# Patient Record
Sex: Female | Born: 1980 | Hispanic: Yes | Marital: Married | Smoking: Never smoker
Health system: Southern US, Community
[De-identification: ages and names within clinical notes are randomized; demographics above are authoritative.]

## PROBLEM LIST (undated history)

## (undated) ENCOUNTER — Inpatient Hospital Stay (HOSPITAL_COMMUNITY): Payer: Self-pay

## (undated) DIAGNOSIS — Z789 Other specified health status: Secondary | ICD-10-CM

## (undated) DIAGNOSIS — O139 Gestational [pregnancy-induced] hypertension without significant proteinuria, unspecified trimester: Secondary | ICD-10-CM

## (undated) DIAGNOSIS — E039 Hypothyroidism, unspecified: Secondary | ICD-10-CM

## (undated) HISTORY — PX: NO PAST SURGERIES: SHX2092

## (undated) HISTORY — DX: Gestational (pregnancy-induced) hypertension without significant proteinuria, unspecified trimester: O13.9

---

## 2017-04-03 LAB — OB RESULTS CONSOLE ANTIBODY SCREEN: Antibody Screen: NEGATIVE

## 2017-04-03 LAB — OB RESULTS CONSOLE HIV ANTIBODY (ROUTINE TESTING): HIV: NONREACTIVE

## 2017-04-03 LAB — OB RESULTS CONSOLE HEPATITIS B SURFACE ANTIGEN: HEP B S AG: NEGATIVE

## 2017-04-03 LAB — OB RESULTS CONSOLE RPR: RPR: NONREACTIVE

## 2017-04-03 LAB — OB RESULTS CONSOLE ABO/RH: RH TYPE: POSITIVE

## 2017-04-03 LAB — OB RESULTS CONSOLE RUBELLA ANTIBODY, IGM: Rubella: IMMUNE

## 2017-04-03 LAB — OB RESULTS CONSOLE GC/CHLAMYDIA
Chlamydia: NEGATIVE
Gonorrhea: NEGATIVE

## 2017-04-04 ENCOUNTER — Encounter (HOSPITAL_COMMUNITY): Payer: Self-pay

## 2017-04-04 ENCOUNTER — Inpatient Hospital Stay (HOSPITAL_COMMUNITY)
Admission: AD | Admit: 2017-04-04 | Discharge: 2017-04-04 | Disposition: A | Discharge status: Home / Self Care | Payer: Managed Care, Other (non HMO) | Source: Ambulatory Visit | Attending: Obstetrics and Gynecology | Admitting: Obstetrics and Gynecology

## 2017-04-04 DIAGNOSIS — E860 Dehydration: Secondary | ICD-10-CM

## 2017-04-04 DIAGNOSIS — Z3A10 10 weeks gestation of pregnancy: Secondary | ICD-10-CM | POA: Insufficient documentation

## 2017-04-04 DIAGNOSIS — O99281 Endocrine, nutritional and metabolic diseases complicating pregnancy, first trimester: Secondary | ICD-10-CM | POA: Insufficient documentation

## 2017-04-04 DIAGNOSIS — O219 Vomiting of pregnancy, unspecified: Secondary | ICD-10-CM | POA: Insufficient documentation

## 2017-04-04 HISTORY — DX: Other specified health status: Z78.9

## 2017-04-04 LAB — URINALYSIS, ROUTINE W REFLEX MICROSCOPIC
BILIRUBIN URINE: NEGATIVE
Bacteria, UA: NONE SEEN
GLUCOSE, UA: 50 mg/dL — AB
Hgb urine dipstick: NEGATIVE
Ketones, ur: 80 mg/dL — AB
NITRITE: NEGATIVE
PH: 5 (ref 5.0–8.0)
Protein, ur: 30 mg/dL — AB
Specific Gravity, Urine: 1.033 — ABNORMAL HIGH (ref 1.005–1.030)

## 2017-04-04 LAB — POCT PREGNANCY, URINE: Preg Test, Ur: POSITIVE — AB

## 2017-04-04 MED ORDER — DEXTROSE IN LACTATED RINGERS 5 % IV SOLN
Freq: Once | INTRAVENOUS | Status: AC
  Administered 2017-04-04: 23:00:00 via INTRAVENOUS
  Filled 2017-04-04: qty 10

## 2017-04-04 MED ORDER — SODIUM CHLORIDE 0.9 % IV SOLN
25.0000 mg | Freq: Once | INTRAVENOUS | Status: AC
  Administered 2017-04-04: 25 mg via INTRAVENOUS
  Filled 2017-04-04: qty 1

## 2017-04-04 NOTE — MAU Provider Note (Signed)
Chief Complaint: Nausea and Emesis   First Provider Initiated Contact with Patient 04/04/17 2054        SUBJECTIVE HPI: Stacey Parks is a 36 y.o. G1P0 at [redacted]w[redacted]d by LMP who presents to maternity admissions reporting nausea and vomiting. Has been on [place] but has not taken it since Tuesday "because it stopped working".  Has been drinking coconut water but vomited that today.. She denies vaginal bleeding, vaginal itching/burning, urinary symptoms, h/a, dizziness, or fever/chills.    Emesis   This is a recurrent problem. The current episode started 1 to 4 weeks ago. The problem occurs 2 to 4 times per day. The problem has been unchanged. There has been no fever. Pertinent negatives include no abdominal pain, chills, diarrhea, fever, headaches or myalgias. Treatments tried: [place], but stopped taking it.    RN Note: Pt c/o nausea and vomiting for 3 weeks. States she is [redacted] weeks pregnant. States EDD is 10/29/2017. States she is unable to keep anything down. States she has a lot of burning in epigastric area. States she has a prescription for nausea medication-does not help. States she last took the medicine on Tuesday. Pt denies vaginal bleeding.   No past medical history on file. No past surgical history on file. Social History   Social History  . Marital status: Married    Spouse name: N/A  . Number of children: N/A  . Years of education: N/A   Occupational History  . Not on file.   Social History Main Topics  . Smoking status: Not on file  . Smokeless tobacco: Not on file  . Alcohol use Not on file  . Drug use: Unknown  . Sexual activity: Not on file   Other Topics Concern  . Not on file   Social History Narrative  . No narrative on file   No current facility-administered medications on file prior to encounter.    No current outpatient prescriptions on file prior to encounter.   No Known Allergies  I have reviewed patient's Past Medical Hx, Surgical Hx,  Family Hx, Social Hx, medications and allergies.   ROS:  Review of Systems  Constitutional: Negative for chills and fever.  Gastrointestinal: Positive for vomiting. Negative for abdominal pain and diarrhea.  Musculoskeletal: Negative for myalgias.  Neurological: Negative for headaches.   Review of Systems  Other systems negative   Physical Exam  Physical Exam Patient Vitals for the past 24 hrs:  BP Temp Temp src Pulse Resp SpO2 Height Weight  04/04/17 2006 124/68 98.1 F (36.7 C) Oral 83 16 100 %  (1.626 m) 151 lb (68.5 kg)   Constitutional: Well-developed, well-nourished female in no acute distress.  Cardiovascular: normal rate Respiratory: normal effort GI: Abd soft, non-tender. Pos BS x 4 MS: Extremities nontender, no edema, normal ROM Neurologic: Alert and oriented x 4.  GU: Neg CVAT.  PELVIC EXAM: Deferred  FHT 155 by bedside ultrasound  LAB RESULTS Results for orders placed or performed during the hospital encounter of 04/04/17 (from the past 24 hour(s))  Urinalysis, Routine w reflex microscopic     Status: Abnormal   Collection Time: 04/04/17  8:10 PM  Result Value Ref Range   Color, Urine AMBER (A) YELLOW   APPearance HAZY (A) CLEAR   Specific Gravity, Urine 1.033 (H) 1.005 - 1.030   pH 5.0 5.0 - 8.0   Glucose, UA 50 (A) NEGATIVE mg/dL   Hgb urine dipstick NEGATIVE NEGATIVE   Bilirubin Urine NEGATIVE NEGATIVE  Ketones, ur 80 (A) NEGATIVE mg/dL   Protein, ur 30 (A) NEGATIVE mg/dL   Nitrite NEGATIVE NEGATIVE   Leukocytes, UA TRACE (A) NEGATIVE   RBC / HPF 0-5 0 - 5 RBC/hpf   WBC, UA 0-5 0 - 5 WBC/hpf   Bacteria, UA NONE SEEN NONE SEEN   Squamous Epithelial / LPF 0-5 (A) NONE SEEN   Mucus PRESENT   Pregnancy, urine POC     Status: Abnormal   Collection Time: 04/04/17  8:25 PM  Result Value Ref Range   Preg Test, Ur POSITIVE (A) NEGATIVE       IMAGING No results found.  MAU Management/MDM: Ordered IV fluids with Phenergan and with  Multivitamin.  Felt better after fluids Discussed restarting her [place] tonight Also discussed advancing as tolerated, discussed eating a few bites every 2 hours Also discussed that we can use other meds, so notify MD if not feeling better   ASSESSMENT Single IUP at [redacted]w[redacted]d Nausea and vomiting of pregnancy Dehydration  PLAN Discharge home Has Bonjesta at home Advance diet as tolerated Follow up in office  Pt stable at time of discharge. Encouraged to return here or to other Urgent Care/ED if she develops worsening of symptoms, increase in pain, fever, or other concerning symptoms.    Wynelle Bourgeois CNM, MSN Certified Nurse-Midwife 04/04/2017  9:02 PM

## 2017-04-04 NOTE — Discharge Instructions (Signed)
Desidratao, adulto (Dehydration, Adult) A desidratao indica que seu corpo no tem lquidos ou gua suficientes de que ele precisa. Ela acontece quando voc ingere menos lquidos do que perde. Seus rins, crebro e corao no funcionam de maneira apropriada sem a quantidade correta de lquidos.  A desidratao pode variar de leve a intensa. Ela deve ser tratada imediatamente para impedir que se torne intensa. TRATAMENTO DOMICILIAR  Beba lquidos em quantidade suficiente para manter seu xixi (urina) claro ou na cor amarela plida.  Tome gua ou outro lquido lentamente com goles pequenos. Voc pode tambm tentar chupar cubos de gelo.  Ingira alimentos ou bebidas que contenham eletrlitos. Exemplos incluem bananas e bebidas esportivas.  Tome medicamentos vendidos com ou sem prescrio somente conforme indicado pelo seu mdico.  Prepare a soluo de reidratao oral (oral rehydration solution, ORS) de acordo com as instrues que a acompanham. Tome goles da ORS a cada 5 minutos at [employer] retorne ao normal.  Caso vomite (vmito) ou suas fezes estejam lquidas (diarreia), continue tentando beber gua, ORS ou ambas.  Caso sua fezes estejam lquidas, evite: ? Bebidas com cafena. ? Suco de frutas. ? Leite. ? Bebidas carbonatadas.  No tome comprimidos de sal. Eles podem levar a uma concentrao excessiva de sdio no organismo (hipernatremia). OBTENHA AJUDA SE:  No conseguir comer ou beber sem vomitar.  Tiver diarreia leve por mais de 24 horas.  Tiver febre. OBTENHA AJUDA IMEDIATAMENTE SE:   Sentir sede muito forte.  Tiver diarreia muito intensa.  No urinar por 6-8 horas ou urinar somente uma pequena quantidade de urina muito escura.  Sua pele ficar enrugada.  Sentir tontura, confuso ou ambas as coisas. Estas informaes no se destinam a substituir as recomendaes de seu mdico. No deixe de discutir quaisquer dvidas com seu mdico. Document Released: 01/15/2011  Document Revised: 03/23/2015 Elsevier Interactive Patient Education  2017 Elsevier Inc.  Hipermese gravdica (Hyperemesis Gravidarum) A hipermese gravdica  um quadro de nuseas e vmitos intensos que acontece durante a Environmental education officer. A hipermese  pior que as nuseas matutinas. Ela pode fazer com que voc tenha nuseas ou vomite o dia inteiro por muitos dias. Ela pode impedir que voc coma e beba em quantidade suficiente. A hipermese geralmente ocorre durante o primeiro semestre (as primeiras 20 semanas) de gestao. Ela muitas vezes desaparece quando a Consulting civil engineer chega  [employer]. Entretanto, a hipermese s vezes continua durante toda a [place]. CAUSAS A causa deste quadro clnico no  completamente conhecida, mas considera-se que ela esteja relacionada a alteraes dos hormnios durante a [place]. Ela pode ser decorrente do nvel elevado do hormnio da [place] ou de uma elevao do nvel de estrognio no organismo. SINAIS E SINTOMAS  Nuseas e vmitos intensos.  Nuseas que no passam.  Vmitos que no permitem que voc retenha nenhum alimento.  Perda de peso e fluido corporal (desidratao).  Ausncia de vontade de comer ou incapacidade de apreciar [employer] quais voc gostava anteriormente.  DIAGNSTICO Seu mdico far um exame fsico e perguntar sobre seus sintomas. Ele poder tambm solicitar exames de sangue e de urina para se certificar de que nenhuma outra coisa esteja causando o problema. TRATAMENTO Talvez voc s precise de medicamentos para controlar o problema. Caso medicamentos no controlem as nuseas e os vmitos, voc ser tratada [employer] hospital para prevenir desidratao, aumento da acidez do sangue (acidose), perda de peso e [employer] eletrlitos no organismo, que podem prejudicar o beb ainda no nascido (feto). Voc poder precisar de [employer]  de fluidos por via IV. INSTRUES PARA TRATAMENTO DOMICILIAR  Somente tome medicamentos de  venda livre ou vendidos com prescrio mdica conforme orientado por seu mdico.  Tente comer alguns biscoitos de gua e sal ou torradas pela manh antes de se levantar.  Evite alimentos e cheiros que irritem seu estmago.  Evite alimentos gordurosos e temperados.  Faa 5 a 6 pequenas refeies por dia.  No beba lquidos durante as refeies. Beba lquidos entre as [employer].  Na hora do lanche, coma alimentos ricos em protenas, como queijo.  Coma ou chupe alimentos que contm gengibre. O gengibre ajuda a diminuir as nuseas.  Evite cozinhar. O cheiro da comida pode Water quality scientist.  Evite plulas de ferro e multivitamnicos com ferro at 3 a 4 meses de [place]. Mas consulte seu mdico antes de parar de tomar qualquer plula de ferro prescrita.  PROCURE UM MDICO SE:  A dor abdominal aumentar.  Tiver dor de cabea intensa.  Apresentar problemas de viso.  Perder peso.  PROCURE UM MDICO IMEDIATAMENTE SE:  No conseguir reter lquidos.  Vomitar sangue.  Tiver nuseas ou vmitos constantes.  Apresentar fraqueza extrema.  Tiver sede extrema.  Sentir tontura ou fraqueza.  Tiver febre ou sintomas persistentes por mais de 2 a 3 dias.  Tiver febre e seus sintomas piorarem repentinamente.  CERTIFIQUE-SE DE:  Compreender estas instrues.  Observar seu estado de sade.  Procurar um mdico imediatamente se no se sentir bem ou piorar.  Estas informaes no se destinam a substituir as recomendaes de seu mdico. No deixe de discutir quaisquer dvidas com seu mdico. Document Released: 07/02/2005 Document Revised: 04/22/2013 Document Reviewed: 02/11/2013 Elsevier Interactive Patient Education  2017 [employer].

## 2017-04-04 NOTE — MAU Note (Signed)
Pt c/o nausea and vomiting for 3 weeks. States she is [redacted] weeks pregnant. States EDD is 10/29/2017. States she is unable to keep anything down. States she has a lot of burning in epigastric area. States she has a prescription for nausea medication-does not help. States she last took the medicine on Tuesday. Pt denies vaginal bleeding.

## 2017-04-15 ENCOUNTER — Inpatient Hospital Stay (HOSPITAL_COMMUNITY)
Admission: AD | Admit: 2017-04-15 | Discharge: 2017-04-15 | Disposition: A | Discharge status: Home / Self Care | Payer: Managed Care, Other (non HMO) | Source: Ambulatory Visit | Attending: Obstetrics and Gynecology | Admitting: Obstetrics and Gynecology

## 2017-04-15 ENCOUNTER — Encounter (HOSPITAL_COMMUNITY): Payer: Self-pay | Admitting: *Deleted

## 2017-04-15 DIAGNOSIS — O99281 Endocrine, nutritional and metabolic diseases complicating pregnancy, first trimester: Secondary | ICD-10-CM | POA: Insufficient documentation

## 2017-04-15 DIAGNOSIS — Z3A11 11 weeks gestation of pregnancy: Secondary | ICD-10-CM | POA: Diagnosis not present

## 2017-04-15 DIAGNOSIS — O210 Mild hyperemesis gravidarum: Secondary | ICD-10-CM | POA: Insufficient documentation

## 2017-04-15 DIAGNOSIS — R1110 Vomiting, unspecified: Secondary | ICD-10-CM | POA: Diagnosis present

## 2017-04-15 DIAGNOSIS — E860 Dehydration: Secondary | ICD-10-CM | POA: Insufficient documentation

## 2017-04-15 LAB — URINALYSIS, ROUTINE W REFLEX MICROSCOPIC
BILIRUBIN URINE: NEGATIVE
Cellular Cast, UA: 4
GLUCOSE, UA: 50 mg/dL — AB
HGB URINE DIPSTICK: NEGATIVE
KETONES UR: 80 mg/dL — AB
LEUKOCYTES UA: NEGATIVE
Nitrite: NEGATIVE
PROTEIN: 30 mg/dL — AB
Specific Gravity, Urine: 1.026 (ref 1.005–1.030)
pH: 5 (ref 5.0–8.0)

## 2017-04-15 LAB — BASIC METABOLIC PANEL
Anion gap: 11 (ref 5–15)
BUN: 6 mg/dL (ref 6–20)
CALCIUM: 10.2 mg/dL (ref 8.9–10.3)
CO2: 18 mmol/L — AB (ref 22–32)
CREATININE: 0.56 mg/dL (ref 0.44–1.00)
Chloride: 103 mmol/L (ref 101–111)
GFR calc non Af Amer: >60 mL/min (ref >60)
Glucose, Bld: 92 mg/dL (ref 65–99)
Potassium: 3.5 mmol/L (ref 3.5–5.1)
Sodium: 132 mmol/L — ABNORMAL LOW (ref 135–145)

## 2017-04-15 LAB — CBC
HEMATOCRIT: 37.2 % (ref 36.0–46.0)
Hemoglobin: 13.5 g/dL (ref 12.0–15.0)
MCH: 30.3 pg (ref 26.0–34.0)
MCHC: 36.3 g/dL — AB (ref 30.0–36.0)
MCV: 83.6 fL (ref 78.0–100.0)
Platelets: 269 10*3/uL (ref 150–400)
RBC: 4.45 MIL/uL (ref 3.87–5.11)
RDW: 12.7 % (ref 11.5–15.5)
WBC: 8 10*3/uL (ref 4.0–10.5)

## 2017-04-15 MED ORDER — PROMETHAZINE HCL 25 MG/ML IJ SOLN
25.0000 mg | Freq: Once | INTRAMUSCULAR | Status: AC
  Administered 2017-04-15: 25 mg via INTRAVENOUS
  Filled 2017-04-15: qty 1

## 2017-04-15 MED ORDER — M.V.I. ADULT IV INJ
Freq: Once | INTRAVENOUS | Status: AC
  Administered 2017-04-15: 21:00:00 via INTRAVENOUS
  Filled 2017-04-15: qty 10

## 2017-04-15 MED ORDER — LACTATED RINGERS IV BOLUS (SEPSIS)
1000.0000 mL | Freq: Once | INTRAVENOUS | Status: AC
  Administered 2017-04-15: 1000 mL via INTRAVENOUS

## 2017-04-15 MED ORDER — FAMOTIDINE IN NACL 20-0.9 MG/50ML-% IV SOLN
20.0000 mg | Freq: Once | INTRAVENOUS | Status: AC
  Administered 2017-04-15: 20 mg via INTRAVENOUS
  Filled 2017-04-15: qty 50

## 2017-04-15 MED ORDER — PROMETHAZINE HCL 25 MG RE SUPP: 25.0000 mg | Freq: Four times a day (QID) | RECTAL | 2 refills | Status: DC | PRN

## 2017-04-15 NOTE — MAU Provider Note (Signed)
History     CSN: [id number]  Arrival date and time: 04/15/17 1658  First Provider Initiated Contact with Patient 04/15/17 1845      Chief Complaint  Patient presents with  . Emesis   HPI Stacey Parks is a 36 y.o. G1P0 at [redacted]w[redacted]d who presents with n/v. Had previously taken bonjesta, but discontinued b/c it wasn't working. Now taking promethazine but hasn't had any today b/c she keeps vomiting after taking it. Reports vomiting 5 times today. States she hasn't been able to eat since last week & vomits after drinking water & juice. Denies fever/chills, abdominal pain, vaginal bleeding, or diarrhea. States her last BM was 6 weeks ago. Endorses occasional heartburn.   OB History    Gravida Para Term Preterm AB Living   1         0   SAB TAB Ectopic Multiple Live Births                  Past Medical History:  Diagnosis Date  . Medical history non-contributory     Past Surgical History:  Procedure Laterality Date  . NO PAST SURGERIES      Family History  Problem Relation Age of Onset  . Heart disease Father   . Cancer Paternal Aunt   . Cancer Maternal Grandmother     Social History  Substance Use Topics  . Smoking status: Never Smoker  . Smokeless tobacco: Never Used  . Alcohol use No    Allergies: No Known Allergies  No prescriptions prior to admission.    Review of Systems  Constitutional: Negative.   Gastrointestinal: Positive for constipation, nausea and vomiting. Negative for abdominal pain and diarrhea.  Genitourinary: Negative.    Physical Exam   Blood pressure 113/80, pulse 95, temperature 98.4 F (36.9 C), temperature source Oral, resp. rate 16, weight 143 lb (64.9 kg), last menstrual period 01/23/2017, SpO2 100 %.  Physical Exam  Nursing note and vitals reviewed. Constitutional: She is oriented to person, place, and time. She appears well-developed and well-nourished. No distress.  HENT:  Head: Normocephalic and atraumatic.  Eyes:  Conjunctivae are normal. Right eye exhibits no discharge. Left eye exhibits no discharge. No scleral icterus.  Neck: Normal range of motion.  Cardiovascular: Normal rate, regular rhythm and normal heart sounds.   No murmur heard. Respiratory: Effort normal and breath sounds normal. No respiratory distress. She has no wheezes.  GI: Soft. Bowel sounds are normal. She exhibits no distension. There is no tenderness. There is no rebound and no guarding.  Neurological: She is alert and oriented to person, place, and time.  Skin: Skin is warm and dry. She is not diaphoretic.  Psychiatric: She has a normal mood and affect. Her behavior is normal. Judgment and thought content normal.    MAU Course  Procedures Results for orders placed or performed during the hospital encounter of 04/15/17 (from the past 24 hour(s))  Urinalysis, Routine w reflex microscopic     Status: Abnormal   Collection Time: 04/15/17  4:58 PM  Result Value Ref Range   Color, Urine AMBER (A) YELLOW   APPearance HAZY (A) CLEAR   Specific Gravity, Urine 1.026 1.005 - 1.030   pH 5.0 5.0 - 8.0   Glucose, UA 50 (A) NEGATIVE mg/dL   Hgb urine dipstick NEGATIVE NEGATIVE   Bilirubin Urine NEGATIVE NEGATIVE   Ketones, ur 80 (A) NEGATIVE mg/dL   Protein, ur 30 (A) NEGATIVE mg/dL   Nitrite NEGATIVE NEGATIVE  Leukocytes, UA NEGATIVE NEGATIVE   RBC / HPF 0-5 0 - 5 RBC/hpf   WBC, UA 0-5 0 - 5 WBC/hpf   Bacteria, UA RARE (A) NONE SEEN   Squamous Epithelial / LPF 0-5 (A) NONE SEEN   Mucus PRESENT    Hyaline Casts, UA PRESENT    Cellular Cast, UA 4   CBC     Status: Abnormal   Collection Time: 04/15/17  7:17 PM  Result Value Ref Range   WBC 8.0 4.0 - 10.5 K/uL   RBC 4.45 3.87 - 5.11 MIL/uL   Hemoglobin 13.5 12.0 - 15.0 g/dL   HCT 16.1 09.6 - 04.5 %   MCV 83.6 78.0 - 100.0 fL   MCH 30.3 26.0 - 34.0 pg   MCHC 36.3 (H) 30.0 - 36.0 g/dL   RDW 40.9 81.1 - 91.4 %   Platelets 269 150 - 400 K/uL  Basic metabolic panel     Status:  Abnormal   Collection Time: 04/15/17  7:17 PM  Result Value Ref Range   Sodium 132 (L) 135 - 145 mmol/L   Potassium 3.5 3.5 - 5.1 mmol/L   Chloride 103 101 - 111 mmol/L   CO2 18 (L) 22 - 32 mmol/L   Glucose, Bld 92 65 - 99 mg/dL   BUN 6 6 - 20 mg/dL   Creatinine, Ser 7.82 0.44 - 1.00 mg/dL   Calcium 95.6 8.9 - 21.3 mg/dL   GFR calc non Af Amer >60 >60 mL/min   GFR calc Af Amer >60 >60 mL/min   Anion gap 11 5 - 15    MDM FHT 160 by doppler U/a 80+ ketones. IV fluids x 2 bags (LR followed by MVI in D5LR). Phenergan 25 mg IV & pepcid 20 mg IV CBC, BMP  Care turned over to [facility] CNM  Judeth Horn, NP 04/15/2017 8:10 PM  Assessment and Plan   MDM Pt tolerated PO food and fluids after IV fluids/medications.  Consult Dr Senaida Ores with assessment and findings.  Offer Zofran to pt at [redacted]w[redacted]d, follow up this week, steroid taper at next visit in 1 week if needed.  Discussed with the patient the risk of Zofran use in the first trimester of pregnancy. Risks of use in the first trimester include birth defects in babies, specifically cleft lip/palate.  Pt declines using Zofran, although she is after risk of cleft lip/palate at [redacted]w[redacted]d. She prefers to discuss with Dr Mindi Slicker at her appointment on Wednesday. Pt has documented 8 lb weight loss in 1.5 weeks in MAU and per pt report has lost 27 lbs this pregnancy. Recommend pt try something different to get nausea under better control.  Discussed option of trying Phenergan suppositories when unable to keep down PO and adding Pepcid PO twice daily. Pt agrees that this may help her and she will consider other medications if symptoms persist.  Rx for Phenergan 25 mg suppositories Q 6 hours when unable to tolerate PO dose.  Pt will try Pepcid OTC.  She is stable at time of discharge.     A: 1. Hyperemesis of pregnancy   2. Mild dehydration     P: D/C home F/U with Dr Mindi Slicker 04/17/17 as scheduled  Sharen Counter, CNM 10:57 PM

## 2017-04-15 NOTE — MAU Note (Addendum)
Since Friday unable to keep water or juice down.  Prior to that had not been keeping food down.  (empty emesis bag in hand). Has had some abd pain, none now

## 2017-04-15 NOTE — MAU Note (Signed)
8lb documented loss here.

## 2017-04-15 NOTE — Discharge Instructions (Signed)
Try Pepcid 20 mg twice daily as needed along with your nausea medications. Take Phenergan 25 mg by mouth  OR use suppository but not both medications together.

## 2017-07-16 NOTE — L&D Delivery Note (Addendum)
Delivery Note Pt had another fetal deceleration into the 40s just as she was being prepared for pushing. Pitocin was stopped, position changes employed, scalp stim commenced and O2 via mask applied. FHR recovered with pushing and fetal descent was noted and appropriate. NICU team was notified and present. Pt pushed for 40 minutes and at 1:43 PM a viable female was delivered via Vaginal, Spontaneous (Presentation: OA;  ).  APGAR: 7, 9; weight pending .   Placenta status:mildly adherent, delivered intact with some manual application; duncan , .  Cord: 3vc with the following complications: none  Cord pH: n/a  Anesthesia:  Epidural Episiotomy: None Lacerations: 2nd degree Suture Repair: 2.0 3.0 vicryl Est. Blood Loss (mL): 650: boggy uterus despite pitocin per protocol, manual massage and confirmed adequate evacuation of uterus; no lacs noted on cervix or vagina. 800mcg cytotec placed rectally and bleeding improved; FF  Placenta to pathology  Mom to postpartum.  Baby to Couplet care / Skin to Skin.  [name] 11/01/2017, 2:32 PM

## 2017-10-01 LAB — OB RESULTS CONSOLE GBS: GBS: NEGATIVE

## 2017-10-28 ENCOUNTER — Telehealth (HOSPITAL_COMMUNITY): Payer: Self-pay | Admitting: *Deleted

## 2017-10-28 NOTE — Telephone Encounter (Signed)
Preadmission screen  

## 2017-10-29 ENCOUNTER — Inpatient Hospital Stay (HOSPITAL_COMMUNITY)
Admission: AD | Admit: 2017-10-29 | Payer: Managed Care, Other (non HMO) | Source: Ambulatory Visit | Admitting: Obstetrics and Gynecology

## 2017-11-01 ENCOUNTER — Encounter (HOSPITAL_COMMUNITY): Payer: Self-pay

## 2017-11-01 ENCOUNTER — Inpatient Hospital Stay (HOSPITAL_COMMUNITY): Payer: Managed Care, Other (non HMO) | Admitting: Anesthesiology

## 2017-11-01 ENCOUNTER — Inpatient Hospital Stay (HOSPITAL_COMMUNITY)
Admission: RE | Admit: 2017-11-01 | Discharge: 2017-11-03 | DRG: 806 | Disposition: A | Discharge status: Home / Self Care | Payer: Managed Care, Other (non HMO) | Source: Ambulatory Visit | Attending: Obstetrics and Gynecology | Admitting: Obstetrics and Gynecology

## 2017-11-01 DIAGNOSIS — D649 Anemia, unspecified: Secondary | ICD-10-CM | POA: Diagnosis present

## 2017-11-01 DIAGNOSIS — O480 Post-term pregnancy: Principal | ICD-10-CM | POA: Diagnosis present

## 2017-11-01 DIAGNOSIS — O9902 Anemia complicating childbirth: Secondary | ICD-10-CM | POA: Diagnosis present

## 2017-11-01 DIAGNOSIS — Z3A40 40 weeks gestation of pregnancy: Secondary | ICD-10-CM

## 2017-11-01 LAB — CBC
HCT: 37.5 % (ref 36.0–46.0)
Hemoglobin: 13.3 g/dL (ref 12.0–15.0)
MCH: 31.9 pg (ref 26.0–34.0)
MCHC: 35.5 g/dL (ref 30.0–36.0)
MCV: 89.9 fL (ref 78.0–100.0)
PLATELETS: 219 10*3/uL (ref 150–400)
RBC: 4.17 MIL/uL (ref 3.87–5.11)
RDW: 13.8 % (ref 11.5–15.5)
WBC: 8.5 10*3/uL (ref 4.0–10.5)

## 2017-11-01 LAB — TYPE AND SCREEN
ABO/RH(D): B POS
Antibody Screen: NEGATIVE

## 2017-11-01 LAB — ABO/RH: ABO/RH(D): B POS

## 2017-11-01 MED ORDER — LACTATED RINGERS IV SOLN: 500.0000 mL | Freq: Once | INTRAVENOUS | Status: DC

## 2017-11-01 MED ORDER — OXYTOCIN 40 UNITS IN LACTATED RINGERS INFUSION - SIMPLE MED: 2.5000 [IU]/h | INTRAVENOUS | Status: DC

## 2017-11-01 MED ORDER — OXYCODONE HCL 5 MG PO TABS: 10.0000 mg | ORAL_TABLET | ORAL | Status: DC | PRN

## 2017-11-01 MED ORDER — ACETAMINOPHEN 325 MG PO TABS
650.0000 mg | ORAL_TABLET | ORAL | Status: DC | PRN
  Administered 2017-11-02 (×2): 650 mg via ORAL
  Filled 2017-11-01 (×2): qty 2

## 2017-11-01 MED ORDER — SOD CITRATE-CITRIC ACID 500-334 MG/5ML PO SOLN: 30.0000 mL | ORAL | Status: DC | PRN

## 2017-11-01 MED ORDER — COCONUT OIL OIL: 1.0000 "application " | TOPICAL_OIL | Status: DC | PRN

## 2017-11-01 MED ORDER — SENNOSIDES-DOCUSATE SODIUM 8.6-50 MG PO TABS
2.0000 | ORAL_TABLET | ORAL | Status: DC
  Administered 2017-11-01 – 2017-11-02 (×2): 2 via ORAL
  Filled 2017-11-01 (×2): qty 2

## 2017-11-01 MED ORDER — OXYCODONE HCL 5 MG PO TABS
5.0000 mg | ORAL_TABLET | ORAL | Status: DC | PRN
  Administered 2017-11-02 – 2017-11-03 (×2): 5 mg via ORAL
  Filled 2017-11-01 (×2): qty 1

## 2017-11-01 MED ORDER — FENTANYL 2.5 MCG/ML BUPIVACAINE 1/10 % EPIDURAL INFUSION (WH - ANES)
14.0000 mL/h | INTRAMUSCULAR | Status: DC | PRN
  Administered 2017-11-01 (×2): 14 mL/h via EPIDURAL
  Filled 2017-11-01: qty 100

## 2017-11-01 MED ORDER — DIPHENHYDRAMINE HCL 50 MG/ML IJ SOLN: 12.5000 mg | INTRAMUSCULAR | Status: DC | PRN

## 2017-11-01 MED ORDER — FAMOTIDINE IN NACL 20-0.9 MG/50ML-% IV SOLN
INTRAVENOUS | Status: AC
  Filled 2017-11-01: qty 50

## 2017-11-01 MED ORDER — DIPHENHYDRAMINE HCL 25 MG PO CAPS: 25.0000 mg | ORAL_CAPSULE | Freq: Four times a day (QID) | ORAL | Status: DC | PRN

## 2017-11-01 MED ORDER — ZOLPIDEM TARTRATE 5 MG PO TABS: 5.0000 mg | ORAL_TABLET | Freq: Every evening | ORAL | Status: DC | PRN

## 2017-11-01 MED ORDER — LIDOCAINE HCL (PF) 1 % IJ SOLN
INTRAMUSCULAR | Status: DC | PRN
  Administered 2017-11-01: 8 mL

## 2017-11-01 MED ORDER — MISOPROSTOL 200 MCG PO TABS
800.0000 ug | ORAL_TABLET | Freq: Once | ORAL | Status: AC
  Administered 2017-11-01: 800 ug via RECTAL

## 2017-11-01 MED ORDER — ACETAMINOPHEN 325 MG PO TABS: 650.0000 mg | ORAL_TABLET | ORAL | Status: DC | PRN

## 2017-11-01 MED ORDER — WITCH HAZEL-GLYCERIN EX PADS: 1.0000 "application " | MEDICATED_PAD | CUTANEOUS | Status: DC | PRN

## 2017-11-01 MED ORDER — MISOPROSTOL 200 MCG PO TABS
ORAL_TABLET | ORAL | Status: AC
  Filled 2017-11-01: qty 4

## 2017-11-01 MED ORDER — TERBUTALINE SULFATE 1 MG/ML IJ SOLN
0.2500 mg | Freq: Once | INTRAMUSCULAR | Status: DC | PRN
Start: 2017-11-01 — End: 2017-11-01
  Filled 2017-11-01: qty 1

## 2017-11-01 MED ORDER — DIBUCAINE 1 % RE OINT: 1.0000 "application " | TOPICAL_OINTMENT | RECTAL | Status: DC | PRN

## 2017-11-01 MED ORDER — PRENATAL MULTIVITAMIN CH
1.0000 | ORAL_TABLET | Freq: Every day | ORAL | Status: DC
  Administered 2017-11-02 – 2017-11-03 (×2): 1 via ORAL
  Filled 2017-11-01 (×2): qty 1

## 2017-11-01 MED ORDER — IBUPROFEN 600 MG PO TABS
600.0000 mg | ORAL_TABLET | Freq: Four times a day (QID) | ORAL | Status: DC
  Administered 2017-11-01 – 2017-11-03 (×8): 600 mg via ORAL
  Filled 2017-11-01 (×8): qty 1

## 2017-11-01 MED ORDER — OXYCODONE-ACETAMINOPHEN 5-325 MG PO TABS: 2.0000 | ORAL_TABLET | ORAL | Status: DC | PRN

## 2017-11-01 MED ORDER — EPHEDRINE 5 MG/ML INJ
10.0000 mg | INTRAVENOUS | Status: DC | PRN
  Filled 2017-11-01: qty 2

## 2017-11-01 MED ORDER — LACTATED RINGERS IV SOLN
INTRAVENOUS | Status: DC
  Administered 2017-11-01: 1000 mL via INTRAVENOUS

## 2017-11-01 MED ORDER — ONDANSETRON HCL 4 MG/2ML IJ SOLN
4.0000 mg | Freq: Four times a day (QID) | INTRAMUSCULAR | Status: DC | PRN
  Administered 2017-11-01: 4 mg via INTRAVENOUS
  Filled 2017-11-01: qty 2

## 2017-11-01 MED ORDER — ONDANSETRON HCL 4 MG/2ML IJ SOLN: 4.0000 mg | INTRAMUSCULAR | Status: DC | PRN

## 2017-11-01 MED ORDER — BENZOCAINE-MENTHOL 20-0.5 % EX AERO
1.0000 "application " | INHALATION_SPRAY | CUTANEOUS | Status: DC | PRN
  Administered 2017-11-02: 1 via TOPICAL
  Filled 2017-11-01: qty 56

## 2017-11-01 MED ORDER — LACTATED RINGERS IV SOLN: 500.0000 mL | INTRAVENOUS | Status: DC | PRN

## 2017-11-01 MED ORDER — OXYCODONE-ACETAMINOPHEN 5-325 MG PO TABS: 1.0000 | ORAL_TABLET | ORAL | Status: DC | PRN

## 2017-11-01 MED ORDER — OXYTOCIN BOLUS FROM INFUSION: 500.0000 mL | Freq: Once | INTRAVENOUS | Status: DC

## 2017-11-01 MED ORDER — FENTANYL CITRATE (PF) 100 MCG/2ML IJ SOLN
50.0000 ug | INTRAMUSCULAR | Status: DC | PRN
  Administered 2017-11-01: 100 ug via INTRAVENOUS
  Filled 2017-11-01: qty 2

## 2017-11-01 MED ORDER — SIMETHICONE 80 MG PO CHEW: 80.0000 mg | CHEWABLE_TABLET | ORAL | Status: DC | PRN

## 2017-11-01 MED ORDER — FAMOTIDINE IN NACL 20-0.9 MG/50ML-% IV SOLN
20.0000 mg | Freq: Once | INTRAVENOUS | Status: AC
  Administered 2017-11-01: 20 mg via INTRAVENOUS

## 2017-11-01 MED ORDER — OXYTOCIN 40 UNITS IN LACTATED RINGERS INFUSION - SIMPLE MED
1.0000 m[IU]/min | INTRAVENOUS | Status: DC
  Administered 2017-11-01: 2 m[IU]/min via INTRAVENOUS
  Filled 2017-11-01: qty 1000

## 2017-11-01 MED ORDER — ONDANSETRON HCL 4 MG PO TABS: 4.0000 mg | ORAL_TABLET | ORAL | Status: DC | PRN

## 2017-11-01 MED ORDER — PHENYLEPHRINE 40 MCG/ML (10ML) SYRINGE FOR IV PUSH (FOR BLOOD PRESSURE SUPPORT)
80.0000 ug | PREFILLED_SYRINGE | INTRAVENOUS | Status: DC | PRN
  Filled 2017-11-01: qty 10
  Filled 2017-11-01: qty 5

## 2017-11-01 MED ORDER — PHENYLEPHRINE 40 MCG/ML (10ML) SYRINGE FOR IV PUSH (FOR BLOOD PRESSURE SUPPORT)
80.0000 ug | PREFILLED_SYRINGE | INTRAVENOUS | Status: DC | PRN
  Administered 2017-11-01: 20 ug via INTRAVENOUS
  Filled 2017-11-01: qty 5

## 2017-11-01 MED ORDER — LIDOCAINE HCL (PF) 1 % IJ SOLN
30.0000 mL | INTRAMUSCULAR | Status: DC | PRN
  Filled 2017-11-01: qty 30

## 2017-11-01 MED ORDER — TETANUS-DIPHTH-ACELL PERTUSSIS 5-2.5-18.5 LF-MCG/0.5 IM SUSP: 0.5000 mL | Freq: Once | INTRAMUSCULAR | Status: DC

## 2017-11-01 NOTE — Anesthesia Postprocedure Evaluation (Signed)
Anesthesia Post Note  Patient: Stacey Parks  Procedure(s) Performed: AN AD HOC LABOR EPIDURAL     Patient location during evaluation: Mother Baby Anesthesia Type: Epidural Level of consciousness: awake Pain management: satisfactory to patient Vital Signs Assessment: post-procedure vital signs reviewed and stable Respiratory status: spontaneous breathing Cardiovascular status: stable Anesthetic complications: no    Last Vitals:  Vitals:   11/01/17 1704 11/01/17 1720  BP: 108/77 112/74  Pulse: (!) 111 90  Resp: 18   Temp: 36.8 C   SpO2: 99%     Last Pain:  Vitals:   11/01/17 1704  TempSrc: Oral  PainSc:    Pain Goal:                 KeyCorpBURGER,Stacey Parks

## 2017-11-01 NOTE — Progress Notes (Signed)
Patient was assisted to bathroom to urinate.  Patient was steady on her feet and not dizzy.  Upon getting to the bathroom she stated that she was feeling weak, RN clarified that she meant dizzy.  RN pulled emergency cord, before help came patient passed out on the toilet.  Cold washcloth was put on her neck and ammonia was given.  Patient got back to bed via stedy.  Fundus was still firm with minimal bleeding and blood pressure was 112/74, pulse 90.  Patient feeling normal once back in bed.  Patient instructed to eat before getting up again and to call for assistance to the bathroom again.

## 2017-11-01 NOTE — Anesthesia Preprocedure Evaluation (Signed)

## 2017-11-01 NOTE — Progress Notes (Signed)
800 cyotec admin rectally

## 2017-11-01 NOTE — Progress Notes (Signed)
Patient ID: Stacey Parks, Stacey Parks   DOB: 04-06-1981, 37 y.o.   MRN: [id number] Checked pt and noted complete dilation. Pt not appreciating any pressure or discomfort Minutes later fetal decel noted into the 60s for 2-3 minutes; recovered to baseline with position change and scalp stimulation  Will forego laboring down and start pushing now Anticipate svd

## 2017-11-01 NOTE — Progress Notes (Signed)
Patient ID: Stacey Parks, female   DOB: 08/08/1980, 37 y.o.   MRN: [id number] Pt doing well. Comfortable with epidural.  VSS EFM - cat 1, 140 Ctxs q 6-457mins SVE 5/100/well applied per RN an hour ago  A/P: Prime at term progressing well in labor         Continue with expectant management

## 2017-11-01 NOTE — H&P (Signed)
Stacey Parks is a 37 y.o.G1P0 female presenting at 61 3/7wks for iol due to term pregnancy with favorable cervix. She is dated per a 7 weeks [place]. Prenatal care was complicated with hyperemesis in first trimester but otherwise no comps. She is AMA. GBS is negative.  Had neg CF and sma; panorama low risk   OB History    Gravida  1   Para      Term      Preterm      AB      Living  0     SAB      TAB      Ectopic      Multiple      Live Births             Past Medical History:  Diagnosis Date  . Medical history non-contributory    Past Surgical History:  Procedure Laterality Date  . NO PAST SURGERIES     Family History: family history includes Cancer in her maternal grandmother and paternal aunt; Heart disease in her father. Social History:  reports that she has never smoked. She has never used smokeless tobacco. She reports that she does not drink alcohol or use drugs.     Maternal Diabetes: No Genetic Screening: Normal Maternal Ultrasounds/Referrals: Normal Fetal Ultrasounds or other Referrals:  None Maternal Substance Abuse:  No Significant Maternal Medications:  None Significant Maternal Lab Results:  Lab values include: Group B Strep negative Other Comments:  None  Review of Systems  Constitutional: Negative for chills, fever, malaise/fatigue and weight loss.  Eyes: Negative for blurred vision.  Respiratory: Negative for shortness of breath.   Cardiovascular: Negative for chest pain and leg swelling.  Gastrointestinal: Negative for abdominal pain, heartburn, nausea and vomiting.  Genitourinary: Negative for dysuria.  Musculoskeletal: Positive for back pain. Negative for myalgias.  Skin: Negative for itching and rash.  Neurological: Negative for dizziness and headaches.  Endo/Heme/Allergies: Does not bruise/bleed easily.  Psychiatric/Behavioral: Negative for depression, hallucinations, substance abuse and suicidal ideas. The patient is  nervous/anxious.    Maternal Medical History:  Reason for admission: Nausea. Term pregnancy with favorable cervix  Contractions: Onset was 13-24 hours ago.   Frequency: rare.   Perceived severity is mild.    Fetal activity: Perceived fetal activity is normal.    Prenatal complications: no prenatal complications Prenatal Complications - Diabetes: none.    Dilation: 3 Effacement (%): 100 Station: -2 Exam by:: Kash Mothershead Blood pressure 124/81, pulse 83, temperature 98.2 F (36.8 C), temperature source Oral, last menstrual period 01/23/2017. Maternal Exam:  Uterine Assessment: Contraction strength is mild.  Contraction frequency is rare.   Abdomen: Patient reports generalized tenderness.  Estimated fetal weight is AGA.   Fetal presentation: vertex  Introitus: Normal vulva. Vulva is negative for condylomata and lesion.  Normal vagina.  Vagina is negative for condylomata and discharge.  Amniotic fluid character: clear.  Pelvis: adequate for delivery.   Cervix: Cervix evaluated by digital exam.     Physical Exam  Constitutional: She is oriented to person, place, and time. She appears well-developed and well-nourished.  Neck: Normal range of motion.  Cardiovascular: Normal rate.  Respiratory: Effort normal.  GI: Soft. There is generalized tenderness.  Genitourinary: Vagina normal and uterus normal. Vulva exhibits no lesion. No vaginal discharge found.  Musculoskeletal: Normal range of motion. She exhibits no edema.  Neurological: She is alert and oriented to person, place, and time.  Skin: Skin is warm.  Psychiatric: She  has a normal mood and affect. Her behavior is normal. Judgment and thought content normal.    Prenatal labs: ABO, Rh: B/Positive/-- (09/19 0000) Antibody: Negative (09/19 0000) Rubella: Immune (09/19 0000) RPR: Nonreactive (09/19 0000)  HBsAg: Negative (09/19 0000)  HIV: Non-reactive (09/19 0000)  GBS: Negative (03/19 0000)   Assessment/Plan: Prime at  40 3/[redacted]wks gestation for elective iol due to Term pregnancy with favorable cervix AROM performed with clear fluid noted; 3/100/-2 Epidural prn Continue pitocin per protocol Anticipate svd    Janean SarkCecilia W Darnise Montag 11/01/2017, 8:56 AM

## 2017-11-01 NOTE — Anesthesia Procedure Notes (Addendum)
Epidural Patient location during procedure: OB Start time: 11/01/2017 9:20 AM End time: 11/01/2017 9:25 AM  Staffing Anesthesiologist: Bethena Midgetddono, Sher Hellinger, MD  Preanesthetic Checklist Completed: patient identified, site marked, surgical consent, pre-op evaluation, timeout performed, IV checked, risks and benefits discussed and monitors and equipment checked  Epidural Patient position: sitting Prep: site prepped and draped and DuraPrep Patient monitoring: continuous pulse ox and blood pressure Approach: midline Location: L4-L5 Injection technique: LOR air  Needle:  Needle type: Tuohy  Needle gauge: 17 G Needle length: 9 cm and 9 Needle insertion depth: 5 cm Catheter type: closed end flexible Catheter size: 19 Gauge Catheter at skin depth: 10 cm Test dose: negative  Assessment Events: blood not aspirated, injection not painful, no injection resistance, negative IV test and no paresthesia

## 2017-11-01 NOTE — Anesthesia Pain Management Evaluation Note (Signed)
  CRNA Pain Management Visit Note  Patient: Stacey Parks, 37 y.o., female  "Hello I am a member of the anesthesia team at [facility]. We have an anesthesia team available at all times to provide care throughout the hospital, including epidural management and anesthesia for C-section. I don't know your plan for the delivery whether it a natural birth, water birth, IV sedation, nitrous supplementation, doula or epidural, but we want to meet your pain goals."   1.Was your pain managed to your expectations on prior hospitalizations?   No prior hospitalizations  2.What is your expectation for pain management during this hospitalization?     Epidural  3.How can we help you reach that goal? unsure  Record the patient's initial score and the patient's pain goal.   Pain: 3  Pain Goal: 5 [facility] wants you to be able to say your pain was always managed very well.  Cephus ShellingBURGER,Trenace Coughlin 11/01/2017

## 2017-11-02 ENCOUNTER — Other Ambulatory Visit: Payer: Self-pay

## 2017-11-02 LAB — CBC
HCT: 24.2 % — ABNORMAL LOW (ref 36.0–46.0)
HEMATOCRIT: 24.2 % — AB (ref 36.0–46.0)
HEMOGLOBIN: 8.3 g/dL — AB (ref 12.0–15.0)
Hemoglobin: 8.5 g/dL — ABNORMAL LOW (ref 12.0–15.0)
MCH: 31.5 pg (ref 26.0–34.0)
MCH: 31.4 pg (ref 26.0–34.0)
MCHC: 35.1 g/dL (ref 30.0–36.0)
MCHC: 34.3 g/dL (ref 30.0–36.0)
MCV: 89.6 fL (ref 78.0–100.0)
MCV: 91.7 fL (ref 78.0–100.0)
PLATELETS: 187 10*3/uL (ref 150–400)
Platelets: 194 10*3/uL (ref 150–400)
RBC: 2.7 MIL/uL — AB (ref 3.87–5.11)
RBC: 2.64 MIL/uL — AB (ref 3.87–5.11)
RDW: 14 % (ref 11.5–15.5)
RDW: 14.1 % (ref 11.5–15.5)
WBC: 18.6 10*3/uL — AB (ref 4.0–10.5)
WBC: 17.2 10*3/uL — ABNORMAL HIGH (ref 4.0–10.5)

## 2017-11-02 LAB — RPR: RPR: NONREACTIVE

## 2017-11-02 MED ORDER — FERROUS SULFATE 325 (65 FE) MG PO TABS
325.0000 mg | ORAL_TABLET | Freq: Once | ORAL | Status: AC
  Administered 2017-11-02: 325 mg via ORAL
  Filled 2017-11-02: qty 1

## 2017-11-02 NOTE — Progress Notes (Addendum)
Patient will call when food tray arrives to take pain medication.

## 2017-11-02 NOTE — Lactation Note (Signed)
This note was copied from a baby's chart. Lactation Consultation Note  Patient Name: Stacey Parks ZOXWR'UToday's Date: 11/02/2017 Reason for consult: Initial assessment;Primapara;1st time breastfeeding;Term;Infant weight loss  2% weight loss, baby 22 hours old.  LC reviewed and updated the doc flow sheets per mom and dad.  Per mom baby last attempted at 1100, baby sleepy.  LC checked baby, diaper dry, and assisted to latch on the right breast/ football after  [facility] breast massage, hand express,tiny drop noted, baby awake, and  LC showed mom how to tap baby's upper lip and wait for the wide open mouth.  Baby latched with wide open mouth, and sluggishly fed for 7 mins, few swallows, with stimulation.  LC reviewed basics of breast feeding and answered all mom,dad's, uncles, and grandmothers breast  Feeding questions.  LC stressed the importance of rest for mom , plenty of fluids,  Prior to latch - breast massage, hand express, and breast compressions with latch and intermittent.  Watching for feeding cues,nutritive vs non - nutritive feeding when latched/ and to watched about hanging  Out latched.  Mother informed of post-discharge support and given phone number to the lactation department, including services for phone call assistance; out-patient appointments; and breastfeeding support group. List of other breastfeeding resources in the community given in the handout. Encouraged mother to call for problems or concerns related to breastfeeding.    Maternal Data Has patient been taught Hand Expression?: Yes Does the patient have breastfeeding experience prior to this delivery?: No  Feeding Feeding Type: Breast Fed Length of feed: 7 min(few swallows noted )  LATCH Score Latch: Grasps breast easily, tongue down, lips flanged, rhythmical sucking.  Audible Swallowing: A few with stimulation  Type of Nipple: Everted at rest and after stimulation  Comfort  (Breast/Nipple): Soft / non-tender  Hold (Positioning): Assistance needed to correctly position infant at breast and maintain latch.  LATCH Score: 8  Interventions Interventions: Breast feeding basics reviewed;Assisted with latch;Skin to skin;Breast massage;Hand express;Breast compression;Adjust position;Support pillows;Position options  Lactation Tools Discussed/Used WIC Program: No   Consult Status Consult Status: Follow-up Date: 11/03/17 Follow-up type: In-patient    Matilde SprangMargaret Ann Dontee Jaso 11/02/2017, 1:10 PM

## 2017-11-02 NOTE — Progress Notes (Addendum)
Patient ID: Stacey Parks, female   DOB: Jan 18, 1981, 37 y.o.   MRN: [id number] Pt sitting up in chair - breastfeeding. Informed of pt having LOC yesterday when was up to bathroom (with assistance). She denies any lightheadedness or dizziness at this time. No CP or SOB. Lochia moderate, pain controlled with ibuprofen.   VS 105-131/57-74 ABD - FF GU - deferred as pt sitting and breastfeeding EXT - no edema  18.6>8.5<187  A/P: PPD#1 s/p svd with pp hemorrhage         Recheck cbc in 4 hours          Start on iron sulfate         Routine pp care           Discussed care with charge nurse

## 2017-11-03 ENCOUNTER — Encounter (HOSPITAL_COMMUNITY): Payer: Self-pay

## 2017-11-03 LAB — CBC
HEMATOCRIT: 21.8 % — AB (ref 36.0–46.0)
HEMATOCRIT: 22.8 % — AB (ref 36.0–46.0)
Hemoglobin: 7.5 g/dL — ABNORMAL LOW (ref 12.0–15.0)
Hemoglobin: 7.8 g/dL — ABNORMAL LOW (ref 12.0–15.0)
MCH: 32.1 pg (ref 26.0–34.0)
MCH: 31.8 pg (ref 26.0–34.0)
MCHC: 34.4 g/dL (ref 30.0–36.0)
MCHC: 34.2 g/dL (ref 30.0–36.0)
MCV: 93.2 fL (ref 78.0–100.0)
MCV: 93.1 fL (ref 78.0–100.0)
Platelets: 180 10*3/uL (ref 150–400)
Platelets: 209 10*3/uL (ref 150–400)
RBC: 2.34 MIL/uL — AB (ref 3.87–5.11)
RBC: 2.45 MIL/uL — ABNORMAL LOW (ref 3.87–5.11)
RDW: 14.7 % (ref 11.5–15.5)
RDW: 14.4 % (ref 11.5–15.5)
WBC: 14.3 10*3/uL — AB (ref 4.0–10.5)
WBC: 14.4 10*3/uL — AB (ref 4.0–10.5)

## 2017-11-03 MED ORDER — IBUPROFEN 600 MG PO TABS: 600.0000 mg | ORAL_TABLET | Freq: Four times a day (QID) | ORAL | 1 refills | Status: DC | PRN

## 2017-11-03 MED ORDER — FERROUS SULFATE 325 (65 FE) MG PO TABS: 325.0000 mg | ORAL_TABLET | ORAL | 2 refills | Status: DC

## 2017-11-03 MED ORDER — OXYCODONE HCL 5 MG PO TABS: 5.0000 mg | ORAL_TABLET | ORAL | 0 refills | Status: AC | PRN

## 2017-11-03 NOTE — Discharge Instructions (Signed)
Nothing in vagina for 6 weeks.  No sex, tampons, and douching.  Other instructions as in [facility] Discharge Booklet. °

## 2017-11-03 NOTE — Progress Notes (Signed)
Patient ID: Stacey Parks, female   DOB: 19-Oct-1980, 37 y.o.   MRN: [id number] Pt reports pain well controlled with ibuprofen and oxycodone. Was able to ambulate to bathroom with no assistance. Lochia mild. No fever or chills, CP or SOB. Breastfeeding well after assistance of Advertising copywriterlactation consultant.   VS- 111/65  ABD - soft, FF and below umbilicus 3cm GU - tender at repair site as expected, scant to no bleeding on fundal massage, cervix intact, no hematoma or swelling noted.  EXT - mild edema only, no homans   14.3>7.5<180  A/P: PPD#2 s/p svd with pp hemorrhage - symptomatic         Will recheck cbc at noon - discussed blood transfusion if Hg continues to drop; further assessment would be indicated as well if so           If stable, consider discharge to home later today - discussed dietary options to improve hg and iron supps every other day

## 2017-11-03 NOTE — Discharge Summary (Signed)
OB Discharge Summary     Patient Name: Stacey Parks DOB: January 03, 1981 MRN: [id number]  Date of admission: 11/01/2017 Delivering MD: Pryor OchoaBANGA, Eli [facility]   Date of discharge: 11/03/2017  Admitting diagnosis: INDUCTION Intrauterine pregnancy: 7196w5d     Secondary diagnosis:  Active Problems:   Labor and delivery, indication for care   SVD (spontaneous vaginal delivery)   Postpartum care following vaginal delivery   PPH (postpartum hemorrhage)  Additional problems: none     Discharge diagnosis: Term Pregnancy Delivered and Anemia                                                                                                Post partum procedures:none  Augmentation: AROM and Pitocin  Complications: Hemorrhage>105300mL  Hospital course:  Induction of Labor With Vaginal Delivery   37 y.o. yo G1P0 at 4696w5d was admitted to the hospital 11/01/2017 for induction of labor.  Indication for induction: Postdates and Favorable cervix at term.  Patient had an uncomplicated labor course as follows: Membrane Rupture Time/Date: 8:48 AM ,11/01/2017   Intrapartum Procedures: Episiotomy: None [1]                                         Lacerations:  2nd degree [3]  Patient had delivery of a Viable infant.  Information for the patient's newborn:  Franciso Bendabosa Parks, Girl Denetria [[id number]][[id number]]  Delivery Method: Vaginal, Spontaneous(Filed from Delivery Summary)   11/01/2017  Details of delivery can be found in separate delivery note.  Patient had a routine postpartum course. Patient is discharged home 11/03/17.  Physical exam  Vitals:   11/01/17 1704 11/02/17 0537 11/02/17 1742 11/03/17 0639  BP:  (!) 105/57 (!) 102/59 111/65  Pulse:  76 98 91  Resp:  18 18 18   Temp:  98.6 F (37 C) 98.8 F (37.1 C) 98.3 F (36.8 C)  TempSrc:  Oral Oral Oral  SpO2: 99%  100%    General: alert, cooperative and no distress Lochia: appropriate Uterine Fundus: firm Incision: N/A DVT Evaluation: No  evidence of DVT seen on physical exam. Calf/Ankle edema is present Labs: Lab Results  Component Value Date   WBC 14.3 (H) 11/03/2017   HGB 7.5 (L) 11/03/2017   HCT 21.8 (L) 11/03/2017   MCV 93.2 11/03/2017   PLT 180 11/03/2017   CMP Latest Ref Rng & Units 04/15/2017  Glucose 65 - 99 mg/dL 92  BUN 6 - 20 mg/dL 6  Creatinine 4.690.44 - 6.291.00 mg/dL 5.280.56  Sodium 413135 - 244145 mmol/L 132(L)  Potassium 3.5 - 5.1 mmol/L 3.5  Chloride 101 - 111 mmol/L 103  CO2 22 - 32 mmol/L 18(L)  Calcium 8.9 - 10.3 mg/dL 01.010.2    Discharge instruction: per After Visit Summary and "Baby and Me Booklet".  After visit meds:  Allergies as of 11/03/2017   No Known Allergies     Medication List    TAKE these medications   ferrous sulfate 325 (65 FE) MG tablet Take 1 tablet (  325 mg total) by mouth every other day.   ibuprofen 600 MG tablet Commonly known as:  ADVIL,MOTRIN Take 1 tablet (600 mg total) by mouth every 6 (six) hours as needed for moderate pain or cramping.   oxyCODONE 5 MG immediate release tablet Commonly known as:  Oxy IR/ROXICODONE Take 1 tablet (5 mg total) by mouth every 4 (four) hours as needed for up to 7 days for severe pain or breakthrough pain (pain scale 4-7).   prenatal multivitamin Tabs tablet Take 1 tablet by mouth daily at 12 noon.       Diet: routine diet  Activity: Advance as tolerated. Pelvic rest for 6 weeks.   Outpatient follow up:4 weeks Follow up Appt:No future appointments. Follow up Visit:No follow-ups on file.  Postpartum contraception: Not Discussed  Newborn Data: Live born female  Birth Weight: 7 lb 12 oz (3515 g) APGAR: 7, 9  Newborn Delivery   Birth date/time:  11/01/2017 13:43:00 Delivery type:  Vaginal, Spontaneous     Baby Feeding: Breast Disposition:home with mother   11/03/2017 Cathrine Muster, DO

## 2017-11-03 NOTE — Lactation Note (Signed)
This note was copied from a baby's chart. Lactation Consultation Note  Patient Name: Stacey Parks ZOXWR'UToday's Date: 11/03/2017 Reason for consult: Follow-up assessment;Infant weight loss;Primapara;1st time breastfeeding Baby is 6144 hours old  Maternal Data Has patient been taught Hand Expression?: Yes  Feeding Feeding Type: Breast Fed Length of feed: 18 min(swallows noted )  LATCH Score Latch: Grasps breast easily, tongue down, lips flanged, rhythmical sucking.  Audible Swallowing: A few with stimulation  Type of Nipple: Everted at rest and after stimulation  Comfort (Breast/Nipple): Soft / non-tender  Hold (Positioning): Assistance needed to correctly position infant at breast and maintain latch.  LATCH Score: 8  Interventions Interventions: Breast feeding basics reviewed;Assisted with latch;Skin to skin;Breast massage;Breast compression;Pre-pump if needed;Adjust position;Support pillows;Position options  Lactation Tools Discussed/Used Tools: Pump;Shells;Flanges Flange Size: 27;24 Shell Type: Inverted Breast pump type: Manual Pump Review: Setup, frequency, and cleaning;Milk Storage Initiated by:: MAI  Date initiated:: 11/03/17   Consult Status Consult Status: Follow-up Date: 11/03/17 Follow-up type: In-patient    Matilde SprangMargaret Ann Nashton Belson 11/03/2017, 9:57 AM

## 2018-07-16 NOTE — L&D Delivery Note (Signed)
Delivery Note Pt labored well to complete. She pushed well and at 5:32 PM a viable female was delivered via Vaginal, Spontaneous (Presentation: LOA;  ).  APGAR: 8, 9; weight pending.   Placenta status: delivered intact but residual membranes, .  Cord: 3vc with the following complications: none .  Cord pH: n/a  Anesthesia:  Epidrual Episiotomy: None Lacerations: 2nd degree Suture Repair: 2.0 vicryl Est. Blood Loss (mL): 952 Manual exploration performed - membranes removed. Non adherent placenta. 845mcg rectal cytotec placed. One dose of TXA IV. Bleeding stable  Mom to postpartum.  Baby to Couplet care / Skin to Skin.  Isaiah Serge 03/30/2019, 6:39 PM

## 2018-09-23 LAB — OB RESULTS CONSOLE HIV ANTIBODY (ROUTINE TESTING): HIV: NONREACTIVE

## 2018-09-23 LAB — OB RESULTS CONSOLE RUBELLA ANTIBODY, IGM: Rubella: IMMUNE

## 2018-09-23 LAB — OB RESULTS CONSOLE ANTIBODY SCREEN: Antibody Screen: NEGATIVE

## 2018-09-23 LAB — OB RESULTS CONSOLE GC/CHLAMYDIA
Chlamydia: NEGATIVE
Gonorrhea: NEGATIVE

## 2018-09-23 LAB — OB RESULTS CONSOLE ABO/RH: RH Type: POSITIVE

## 2018-09-23 LAB — OB RESULTS CONSOLE RPR: RPR: NONREACTIVE

## 2018-09-23 LAB — OB RESULTS CONSOLE HEPATITIS B SURFACE ANTIGEN: Hepatitis B Surface Ag: NEGATIVE

## 2019-03-13 LAB — OB RESULTS CONSOLE GBS: GBS: NEGATIVE

## 2019-03-24 ENCOUNTER — Telehealth (HOSPITAL_COMMUNITY): Payer: Self-pay | Admitting: *Deleted

## 2019-03-24 ENCOUNTER — Encounter (HOSPITAL_COMMUNITY): Payer: Self-pay | Admitting: *Deleted

## 2019-03-24 NOTE — Telephone Encounter (Signed)
Preadmission screen  

## 2019-03-28 ENCOUNTER — Other Ambulatory Visit: Payer: Self-pay

## 2019-03-28 ENCOUNTER — Other Ambulatory Visit (HOSPITAL_COMMUNITY)
Admission: RE | Admit: 2019-03-28 | Discharge: 2019-03-28 | Disposition: A | Discharge status: Home / Self Care | Payer: BC Managed Care – PPO | Source: Ambulatory Visit | Attending: Obstetrics and Gynecology | Admitting: Obstetrics and Gynecology

## 2019-03-28 DIAGNOSIS — Z20828 Contact with and (suspected) exposure to other viral communicable diseases: Secondary | ICD-10-CM | POA: Diagnosis not present

## 2019-03-28 DIAGNOSIS — Z01812 Encounter for preprocedural laboratory examination: Secondary | ICD-10-CM | POA: Diagnosis not present

## 2019-03-28 LAB — SARS CORONAVIRUS 2 (TAT 6-24 HRS): SARS Coronavirus 2: NEGATIVE

## 2019-03-28 NOTE — MAU Note (Signed)
Pt here for PAT covid swab. Denies symptoms. Swab collected.  

## 2019-03-29 NOTE — H&P (Signed)
Stacey Parks is a 38 y.o.G2)1001 female presenting at 2239 2/7wks for elective iol. Pt is dated per LMP and confirmed with a 10 week US. She was screened q trimester due to thyroid disorder - all wnl. She had a negative panorama and essential panel screen. Her GBS screen was negative. She was 5cm dilated at her last office visit.  Pt had a history of postpartum hemorrhage after last delivery OB History    Gravida  2   Para  1   Term  1   Preterm      AB      Living  1     SAB      TAB      Ectopic      Multiple      Live Births  1          Past Medical History:  Diagnosis Date  . Medical history non-contributory   . PPH (postpartum hemorrhage) 11/03/2017  . Pregnancy induced hypertension    Past Surgical History:  Procedure Laterality Date  . NO PAST SURGERIES     Family History: family history includes Cancer in her maternal grandmother and paternal aunt; Heart disease in her father. Social History:  reports that she has never smoked. She has never used smokeless tobacco. She reports that she does not drink alcohol or use drugs.     Maternal Diabetes: No Genetic Screening: Normal Maternal Ultrasounds/Referrals: Normal Fetal Ultrasounds or other Referrals:  None Maternal Substance Abuse:  No Significant Maternal Medications:  None Significant Maternal Lab Results:  Group B Strep negative Other Comments:  None  Review of Systems  Constitutional: Positive for malaise/fatigue. Negative for chills, fever and weight loss.  Eyes: Negative for blurred vision and double vision.  Respiratory: Positive for shortness of breath.   Cardiovascular: Negative for chest pain.  Gastrointestinal: Positive for abdominal pain. Negative for vomiting.  Genitourinary: Negative for dysuria.  Musculoskeletal: Positive for back pain and myalgias.  Skin: Negative for itching and rash.  Neurological: Negative for dizziness and headaches.  Psychiatric/Behavioral: Negative  for depression, hallucinations, substance abuse and suicidal ideas. The patient is nervous/anxious.    Maternal Medical History:  Reason for admission: iol at term , favorable cervix  Contractions: Onset was more than 2 days ago.   Frequency: rare and irregular.   Perceived severity is moderate.    Fetal activity: Perceived fetal activity is normal.   Last perceived fetal movement was within the past hour.    Prenatal Complications - Diabetes: none.      unknown if currently breastfeeding. Maternal Exam:  Uterine Assessment: Contraction strength is moderate.  Contraction frequency is irregular and rare.   Abdomen: Patient reports generalized tenderness.  Estimated fetal weight is AGA.   Fetal presentation: vertex  Introitus: Normal vulva. Vulva is negative for condylomata and lesion.  Vagina is negative for condylomata and discharge.  Pelvis: adequate for delivery.   Cervix: Cervix evaluated by digital exam.     Physical Exam  Constitutional: She is oriented to person, place, and time. She appears well-developed and well-nourished.  Neck: Normal range of motion.  Cardiovascular: Normal rate.  Respiratory: Effort normal.  GI: There is generalized abdominal tenderness.  Genitourinary:    Vulva and vagina normal.     No vulval condylomata or lesion noted.     No vaginal discharge.   Musculoskeletal: Normal range of motion.        General: Edema present.  Neurological: She is alert  and oriented to person, place, and time.  Skin: Skin is warm and dry.  Psychiatric: She has a normal mood and affect. Her behavior is normal. Judgment and thought content normal.    Prenatal labs: ABO, Rh: B/Positive/-- (03/10 0000) Antibody: Negative (03/10 0000) Rubella: Immune (03/10 0000) RPR: Nonreactive (03/10 0000)  HBsAg: Negative (03/10 0000)  HIV: Non-reactive (03/10 0000)  GBS: Negative/-- (08/28 0000)   Assessment/Plan: [id number] female here for iol due to favorable  cervix at term Admit GBS neg Sars covid screen Pitocin per protocol AROM  Pain control prn Anticipate svd  Venetia Night Banga 03/29/2019, 8:49 PM

## 2019-03-30 ENCOUNTER — Inpatient Hospital Stay (HOSPITAL_COMMUNITY): Payer: BC Managed Care – PPO

## 2019-03-30 ENCOUNTER — Inpatient Hospital Stay (HOSPITAL_COMMUNITY): Payer: BC Managed Care – PPO | Admitting: Anesthesiology

## 2019-03-30 ENCOUNTER — Other Ambulatory Visit: Payer: Self-pay

## 2019-03-30 ENCOUNTER — Encounter (HOSPITAL_COMMUNITY): Payer: Self-pay | Admitting: *Deleted

## 2019-03-30 ENCOUNTER — Inpatient Hospital Stay (HOSPITAL_COMMUNITY)
Admission: AD | Admit: 2019-03-30 | Discharge: 2019-03-31 | DRG: 807 | Disposition: A | Discharge status: Home / Self Care | Payer: BC Managed Care – PPO | Attending: Obstetrics and Gynecology | Admitting: Obstetrics and Gynecology

## 2019-03-30 DIAGNOSIS — Z3689 Encounter for other specified antenatal screening: Secondary | ICD-10-CM

## 2019-03-30 DIAGNOSIS — O368193 Decreased fetal movements, unspecified trimester, fetus 3: Secondary | ICD-10-CM | POA: Diagnosis not present

## 2019-03-30 DIAGNOSIS — O368130 Decreased fetal movements, third trimester, not applicable or unspecified: Secondary | ICD-10-CM | POA: Diagnosis present

## 2019-03-30 DIAGNOSIS — O09523 Supervision of elderly multigravida, third trimester: Secondary | ICD-10-CM | POA: Diagnosis not present

## 2019-03-30 DIAGNOSIS — Z3A39 39 weeks gestation of pregnancy: Secondary | ICD-10-CM

## 2019-03-30 DIAGNOSIS — O26893 Other specified pregnancy related conditions, third trimester: Secondary | ICD-10-CM | POA: Diagnosis present

## 2019-03-30 DIAGNOSIS — O368190 Decreased fetal movements, unspecified trimester, not applicable or unspecified: Secondary | ICD-10-CM | POA: Diagnosis present

## 2019-03-30 DIAGNOSIS — Z23 Encounter for immunization: Secondary | ICD-10-CM | POA: Diagnosis not present

## 2019-03-30 HISTORY — DX: Hypothyroidism, unspecified: E03.9

## 2019-03-30 LAB — CBC
HCT: 38.5 % (ref 36.0–46.0)
Hemoglobin: 12.2 g/dL (ref 12.0–15.0)
MCH: 28.8 pg (ref 26.0–34.0)
MCHC: 31.7 g/dL (ref 30.0–36.0)
MCV: 91 fL (ref 80.0–100.0)
Platelets: 205 10*3/uL (ref 150–400)
RBC: 4.23 MIL/uL (ref 3.87–5.11)
RDW: 13.8 % (ref 11.5–15.5)
WBC: 7 10*3/uL (ref 4.0–10.5)
nRBC: 0 % (ref 0.0–0.2)

## 2019-03-30 LAB — TYPE AND SCREEN
ABO/RH(D): B POS
Antibody Screen: NEGATIVE

## 2019-03-30 LAB — COMPREHENSIVE METABOLIC PANEL
ALT: 11 U/L (ref 0–44)
AST: 22 U/L (ref 15–41)
Albumin: 3.1 g/dL — ABNORMAL LOW (ref 3.5–5.0)
Alkaline Phosphatase: 224 U/L — ABNORMAL HIGH (ref 38–126)
Anion gap: 11 (ref 5–15)
BUN: 15 mg/dL (ref 6–20)
CO2: 18 mmol/L — ABNORMAL LOW (ref 22–32)
Calcium: 9.4 mg/dL (ref 8.9–10.3)
Chloride: 107 mmol/L (ref 98–111)
Creatinine, Ser: 0.67 mg/dL (ref 0.44–1.00)
GFR calc Af Amer: >60 mL/min (ref >60)
GFR calc non Af Amer: >60 mL/min (ref >60)
Glucose, Bld: 75 mg/dL (ref 70–99)
Potassium: 4.3 mmol/L (ref 3.5–5.1)
Sodium: 136 mmol/L (ref 135–145)
Total Bilirubin: 0.2 mg/dL — ABNORMAL LOW (ref 0.3–1.2)
Total Protein: 7.2 g/dL (ref 6.5–8.1)

## 2019-03-30 LAB — ABO/RH: ABO/RH(D): B POS

## 2019-03-30 MED ORDER — INFLUENZA VAC SPLIT QUAD 0.5 ML IM SUSY
0.5000 mL | PREFILLED_SYRINGE | INTRAMUSCULAR | Status: AC
  Administered 2019-03-31: 14:00:00 0.5 mL via INTRAMUSCULAR
  Filled 2019-03-30 (×3): qty 0.5

## 2019-03-30 MED ORDER — PHENYLEPHRINE 40 MCG/ML (10ML) SYRINGE FOR IV PUSH (FOR BLOOD PRESSURE SUPPORT)
80.0000 ug | PREFILLED_SYRINGE | INTRAVENOUS | Status: DC | PRN
  Filled 2019-03-30: qty 10

## 2019-03-30 MED ORDER — OXYCODONE HCL 5 MG PO TABS: 5.0000 mg | ORAL_TABLET | ORAL | Status: DC | PRN

## 2019-03-30 MED ORDER — MISOPROSTOL 200 MCG PO TABS
800.0000 ug | ORAL_TABLET | Freq: Once | ORAL | Status: AC
  Administered 2019-03-30: 800 ug via RECTAL

## 2019-03-30 MED ORDER — BENZOCAINE-MENTHOL 20-0.5 % EX AERO
1.0000 "application " | INHALATION_SPRAY | CUTANEOUS | Status: DC | PRN
  Filled 2019-03-30: qty 56

## 2019-03-30 MED ORDER — LIDOCAINE HCL (PF) 1 % IJ SOLN
INTRAMUSCULAR | Status: DC | PRN
  Administered 2019-03-30 (×2): 4 mL via EPIDURAL

## 2019-03-30 MED ORDER — SENNOSIDES-DOCUSATE SODIUM 8.6-50 MG PO TABS
2.0000 | ORAL_TABLET | ORAL | Status: DC
  Administered 2019-03-30: 2 via ORAL
  Filled 2019-03-30: qty 2

## 2019-03-30 MED ORDER — SIMETHICONE 80 MG PO CHEW: 80.0000 mg | CHEWABLE_TABLET | ORAL | Status: DC | PRN

## 2019-03-30 MED ORDER — OXYTOCIN BOLUS FROM INFUSION
500.0000 mL | Freq: Once | INTRAVENOUS | Status: AC
  Administered 2019-03-30: 500 mL via INTRAVENOUS

## 2019-03-30 MED ORDER — ONDANSETRON HCL 4 MG/2ML IJ SOLN: 4.0000 mg | INTRAMUSCULAR | Status: DC | PRN

## 2019-03-30 MED ORDER — OXYCODONE-ACETAMINOPHEN 5-325 MG PO TABS: 2.0000 | ORAL_TABLET | ORAL | Status: DC | PRN

## 2019-03-30 MED ORDER — ZOLPIDEM TARTRATE 5 MG PO TABS: 5.0000 mg | ORAL_TABLET | Freq: Every evening | ORAL | Status: DC | PRN

## 2019-03-30 MED ORDER — DIBUCAINE (PERIANAL) 1 % EX OINT: 1.0000 "application " | TOPICAL_OINTMENT | CUTANEOUS | Status: DC | PRN

## 2019-03-30 MED ORDER — METHYLERGONOVINE MALEATE 0.2 MG PO TABS: 0.2000 mg | ORAL_TABLET | ORAL | Status: DC | PRN

## 2019-03-30 MED ORDER — OXYCODONE HCL 5 MG PO TABS: 10.0000 mg | ORAL_TABLET | ORAL | Status: DC | PRN

## 2019-03-30 MED ORDER — COCONUT OIL OIL: 1.0000 "application " | TOPICAL_OIL | Status: DC | PRN

## 2019-03-30 MED ORDER — OXYCODONE-ACETAMINOPHEN 5-325 MG PO TABS: 1.0000 | ORAL_TABLET | ORAL | Status: DC | PRN

## 2019-03-30 MED ORDER — PRENATAL MULTIVITAMIN CH
1.0000 | ORAL_TABLET | Freq: Every day | ORAL | Status: DC
  Administered 2019-03-31: 11:00:00 1 via ORAL
  Filled 2019-03-30: qty 1

## 2019-03-30 MED ORDER — OXYTOCIN 40 UNITS IN NORMAL SALINE INFUSION - SIMPLE MED: 2.5000 [IU]/h | INTRAVENOUS | Status: DC

## 2019-03-30 MED ORDER — IBUPROFEN 600 MG PO TABS
600.0000 mg | ORAL_TABLET | Freq: Four times a day (QID) | ORAL | Status: DC
  Administered 2019-03-30 – 2019-03-31 (×4): 600 mg via ORAL
  Filled 2019-03-30 (×4): qty 1

## 2019-03-30 MED ORDER — OXYTOCIN 40 UNITS IN NORMAL SALINE INFUSION - SIMPLE MED
1.0000 m[IU]/min | INTRAVENOUS | Status: DC
  Administered 2019-03-30: 2 m[IU]/min via INTRAVENOUS
  Filled 2019-03-30: qty 1000

## 2019-03-30 MED ORDER — ACETAMINOPHEN 325 MG PO TABS
650.0000 mg | ORAL_TABLET | ORAL | Status: DC | PRN
  Administered 2019-03-31: 650 mg via ORAL
  Filled 2019-03-30: qty 2

## 2019-03-30 MED ORDER — TRANEXAMIC ACID-NACL 1000-0.7 MG/100ML-% IV SOLN
1000.0000 mg | Freq: Once | INTRAVENOUS | Status: AC | PRN
  Administered 2019-03-30: 1000 mg via INTRAVENOUS

## 2019-03-30 MED ORDER — TETANUS-DIPHTH-ACELL PERTUSSIS 5-2.5-18.5 LF-MCG/0.5 IM SUSP: 0.5000 mL | Freq: Once | INTRAMUSCULAR | Status: DC

## 2019-03-30 MED ORDER — LACTATED RINGERS IV SOLN
500.0000 mL | Freq: Once | INTRAVENOUS | Status: AC
  Administered 2019-03-30: 13:00:00 via INTRAVENOUS

## 2019-03-30 MED ORDER — ONDANSETRON HCL 4 MG/2ML IJ SOLN: 4.0000 mg | Freq: Four times a day (QID) | INTRAMUSCULAR | Status: DC | PRN

## 2019-03-30 MED ORDER — PHENYLEPHRINE 40 MCG/ML (10ML) SYRINGE FOR IV PUSH (FOR BLOOD PRESSURE SUPPORT): 80.0000 ug | PREFILLED_SYRINGE | INTRAVENOUS | Status: DC | PRN

## 2019-03-30 MED ORDER — TRANEXAMIC ACID-NACL 1000-0.7 MG/100ML-% IV SOLN: 1000.0000 mg | INTRAVENOUS | Status: DC

## 2019-03-30 MED ORDER — TERBUTALINE SULFATE 1 MG/ML IJ SOLN: 0.2500 mg | Freq: Once | INTRAMUSCULAR | Status: DC | PRN

## 2019-03-30 MED ORDER — SOD CITRATE-CITRIC ACID 500-334 MG/5ML PO SOLN: 30.0000 mL | ORAL | Status: DC | PRN

## 2019-03-30 MED ORDER — FENTANYL-BUPIVACAINE-NACL 0.5-0.125-0.9 MG/250ML-% EP SOLN
12.0000 mL/h | EPIDURAL | Status: DC | PRN
  Filled 2019-03-30: qty 250

## 2019-03-30 MED ORDER — METHYLERGONOVINE MALEATE 0.2 MG/ML IJ SOLN: 0.2000 mg | INTRAMUSCULAR | Status: DC | PRN

## 2019-03-30 MED ORDER — SODIUM CHLORIDE 0.9 % IV SOLN
1.5000 g | Freq: Four times a day (QID) | INTRAVENOUS | Status: AC
  Administered 2019-03-30 – 2019-03-31 (×4): 1.5 g via INTRAVENOUS
  Filled 2019-03-30: qty 1.5
  Filled 2019-03-30: qty 4
  Filled 2019-03-30: qty 1.5
  Filled 2019-03-30 (×3): qty 4

## 2019-03-30 MED ORDER — LIDOCAINE HCL (PF) 1 % IJ SOLN: 30.0000 mL | INTRAMUSCULAR | Status: DC | PRN

## 2019-03-30 MED ORDER — LACTATED RINGERS IV SOLN: 500.0000 mL | INTRAVENOUS | Status: DC | PRN

## 2019-03-30 MED ORDER — ACETAMINOPHEN 325 MG PO TABS: 650.0000 mg | ORAL_TABLET | ORAL | Status: DC | PRN

## 2019-03-30 MED ORDER — LEVOTHYROXINE SODIUM 50 MCG PO TABS
25.0000 ug | ORAL_TABLET | Freq: Every day | ORAL | Status: DC
  Administered 2019-03-31: 05:00:00 25 ug via ORAL
  Filled 2019-03-30: qty 1

## 2019-03-30 MED ORDER — DIPHENHYDRAMINE HCL 25 MG PO CAPS: 25.0000 mg | ORAL_CAPSULE | Freq: Four times a day (QID) | ORAL | Status: DC | PRN

## 2019-03-30 MED ORDER — ONDANSETRON HCL 4 MG PO TABS: 4.0000 mg | ORAL_TABLET | ORAL | Status: DC | PRN

## 2019-03-30 MED ORDER — MISOPROSTOL 200 MCG PO TABS
ORAL_TABLET | ORAL | Status: AC
  Filled 2019-03-30: qty 4

## 2019-03-30 MED ORDER — LACTATED RINGERS IV SOLN: INTRAVENOUS | Status: DC

## 2019-03-30 MED ORDER — EPHEDRINE 5 MG/ML INJ: 10.0000 mg | INTRAVENOUS | Status: DC | PRN

## 2019-03-30 MED ORDER — DIPHENHYDRAMINE HCL 50 MG/ML IJ SOLN: 12.5000 mg | INTRAMUSCULAR | Status: DC | PRN

## 2019-03-30 MED ORDER — SODIUM CHLORIDE (PF) 0.9 % IJ SOLN
INTRAMUSCULAR | Status: DC | PRN
  Administered 2019-03-30: 12 mL/h via EPIDURAL

## 2019-03-30 MED ORDER — TRANEXAMIC ACID-NACL 1000-0.7 MG/100ML-% IV SOLN
INTRAVENOUS | Status: AC
  Filled 2019-03-30: qty 100

## 2019-03-30 MED ORDER — WITCH HAZEL-GLYCERIN EX PADS: 1.0000 "application " | MEDICATED_PAD | CUTANEOUS | Status: DC | PRN

## 2019-03-30 NOTE — MAU Provider Note (Signed)
History     CSN: [id number]  Arrival date and time: 03/30/19 1009   First Provider Initiated Contact with Patient 03/30/19 1038      Chief Complaint  Patient presents with  . Decreased Fetal Movement   HPI  Ms.  Stacey Parks is a 38 y.o. year old G56P1001 female at [redacted]w[redacted]d weeks gestation who presents to MAU reporting she is scheduled for IOL this morning, having DFM since 2000 last night (9/13). She and her husband state that they initially had an appointment in My Chart for IOL at 0730 this morning, but then that had been changed to 1000. They called [place] office this morning to ask about her IOL time, because they had not been called her to come to the hospital. She and her husband state that she is being induced for high blood pressure (139/87) and she was dilated 5.5 cm on Friday. The patient states that she would normally try to eat something when she has DFM, but she thought she could not eat anything for 4 hours before her induction. She last ate at 0330. She denies VB or LOF. She reports having bleeding on Wednesday 03/25/2019.  Past Medical History:  Diagnosis Date  . Medical history non-contributory   . PPH (postpartum hemorrhage) 11/03/2017  . Pregnancy induced hypertension     Past Surgical History:  Procedure Laterality Date  . NO PAST SURGERIES      Family History  Problem Relation Age of Onset  . Heart disease Father   . Cancer Paternal Aunt   . Cancer Maternal Grandmother     Social History   Tobacco Use  . Smoking status: Never Smoker  . Smokeless tobacco: Never Used  Substance Use Topics  . Alcohol use: No  . Drug use: No    Allergies: No Known Allergies  Medications Prior to Admission  Medication Sig Dispense Refill Last Dose  . Prenatal Vit-Fe Fumarate-FA (PRENATAL MULTIVITAMIN) TABS tablet Take 1 tablet by mouth daily at 12 noon.   03/30/2019 at Unknown time  . ferrous sulfate 325 (65 FE) MG tablet Take 1 tablet  (325 mg total) by mouth every other day. 30 tablet 2   . ibuprofen (ADVIL,MOTRIN) 600 MG tablet Take 1 tablet (600 mg total) by mouth every 6 (six) hours as needed for moderate pain or cramping. 40 tablet 1     Review of Systems  Constitutional: Negative.   HENT: Negative.   Eyes: Negative.   Respiratory: Negative.   Cardiovascular: Negative.   Gastrointestinal: Negative.   Endocrine: Negative.   Genitourinary: Positive for pelvic pain (occ. UC's).  Musculoskeletal: Negative.   Skin: Negative.   Allergic/Immunologic: Negative.   Neurological: Negative.   Hematological: Negative.   Psychiatric/Behavioral: Negative.    Physical Exam   Blood pressure 120/82, pulse 98, temperature 99.2 F (37.3 C), resp. rate 16, SpO2 100 %.  Physical Exam  Nursing note and vitals reviewed. Constitutional: She is oriented to person, place, and time. She appears well-developed and well-nourished.  HENT:  Head: Normocephalic and atraumatic.  Eyes: Pupils are equal, round, and reactive to light.  Neck: Normal range of motion.  Cardiovascular: Normal rate.  Respiratory: Effort normal.  GI: Soft.  Musculoskeletal: Normal range of motion.  Neurological: She is alert and oriented to person, place, and time. She has normal reflexes.  Skin: Skin is warm and dry.  Psychiatric: She has a normal mood and affect. Her behavior is normal. Judgment and thought content normal.  Dilation: 6 Effacement (%): 90 Presentation: Vertex Exam by:: GInger Morris RN   NST - FHR: 145 bpm / moderate variability / accels present / decels absent / TOCO: Occ. UC's with UI noted  MAU Course  Procedures  MDM CEFM OB MFM BPP -- score 8/8  AFI WNL  cephalic presentation  anterior placenta (on preliminary report)  No results found.  Assessment and Plan  NST (non-stress test) reactive on fetal surveillance - Reassurance given that baby is doing well on monitor  Decreased fetal movement - Notified of 8/8 score on  BPP today, very reassuring with reactive NST  Labor and delivery, indication for care - Continue with IOL as previously planned - Awaiting L&D room availability - See Dr. Shella SpearingBanga's H&P - Dr. Mindi SlickerBanga to assume care of pat upon admission to L&D   Stacey Moraolitta Jonika Critz, MSN, CNM 03/30/2019, 10:39 AM

## 2019-03-30 NOTE — Anesthesia Preprocedure Evaluation (Signed)
Anesthesia Evaluation  Patient identified by MRN, date of birth, ID band Patient awake    Reviewed: Allergy & Precautions, H&P , NPO status , Patient's Chart, lab work & pertinent test results, reviewed documented beta blocker date and time   Airway Mallampati: II  TM Distance: >3 FB Neck ROM: full    Dental no notable dental hx.    Pulmonary neg pulmonary ROS,    Pulmonary exam normal breath sounds clear to auscultation       Cardiovascular hypertension, negative cardio ROS Normal cardiovascular exam Rhythm:regular Rate:Normal     Neuro/Psych negative neurological ROS  negative psych ROS   GI/Hepatic negative GI ROS, Neg liver ROS,   Endo/Other  Hypothyroidism   Renal/GU negative Renal ROS     Musculoskeletal   Abdominal   Peds  Hematology negative hematology ROS (+)   Anesthesia Other Findings   Reproductive/Obstetrics (+) Pregnancy                             Anesthesia Physical  Anesthesia Plan  ASA: II  Anesthesia Plan: Epidural   Post-op Pain Management:    Induction:   PONV Risk Score and Plan:   Airway Management Planned:   Additional Equipment:   Intra-op Plan:   Post-operative Plan:   Informed Consent: I have reviewed the patients History and Physical, chart, labs and discussed the procedure including the risks, benefits and alternatives for the proposed anesthesia with the patient or authorized representative who has indicated his/her understanding and acceptance.       Plan Discussed with:   Anesthesia Plan Comments:         Anesthesia Quick Evaluation

## 2019-03-30 NOTE — Progress Notes (Signed)
Patient ID: Stacey Parks, female   DOB: March 08, 1981, 38 y.o.   MRN: [id number] Pt comfortable with epidural. Increased pelvic pressure. +Fms VSS EFM - cat 1 140 TOCO - ctxs q 1-92mins SVE 8-9cm/-1  A/P: G2P1001 progressing well in labor after AROM         Anticipate svd

## 2019-03-30 NOTE — MAU Note (Signed)
.   Stacey Parks is a 38 y.o. at [redacted]w[redacted]d here in MAU reporting: that she is scheduled for an induction today and no one has called her to come in and she says her appointment was at 10 am and thought the hospital had forgotten to call her.. Pt went to labor and delivery and was told that they did not have a bed for her at this time. Pt then told nurses she was having a decrease in fetal movement and came to MAU to be evaluated. Denies pain   Pain score: 0 Vitals:   03/30/19 1024  BP: 120/82  Pulse: 98  Resp: 16  Temp: 99.2 F (37.3 C)  SpO2: 100%     FHT:145 Lab orders placed from triage:

## 2019-03-30 NOTE — Progress Notes (Signed)
Patient ID: Stacey Parks, female   DOB: 1981-02-27, 38 y.o.   MRN: [id number] Pt comfortable with epidural VSS EFM - Cat 1, 140s TOCO, - rare contractions SVE - 6/90/-2  A/P: G2P1001 for iol due to favorable cervix at term         AROM with clear fluid noted         Augment if indicated         Anticipate svd

## 2019-03-30 NOTE — Anesthesia Procedure Notes (Signed)
Epidural Patient location during procedure: OB Start time: 03/30/2019 1:41 PM End time: 03/30/2019 1:51 PM  Staffing Anesthesiologist: Nolon Nations, MD Performed: anesthesiologist   Preanesthetic Checklist Completed: patient identified, pre-op evaluation, timeout performed, IV checked, risks and benefits discussed and monitors and equipment checked  Epidural Patient position: sitting Prep: site prepped and draped and DuraPrep Patient monitoring: heart rate, continuous pulse ox and blood pressure Approach: midline Location: L3-L4 Injection technique: LOR air and LOR saline  Needle:  Needle type: Tuohy  Needle gauge: 17 G Needle length: 9 cm Needle insertion depth: 5 cm Catheter type: closed end flexible Catheter size: 19 Gauge Catheter at skin depth: 10 cm Test dose: negative  Assessment Sensory level: T8 Events: blood not aspirated, injection not painful, no injection resistance, negative IV test and no paresthesia  Additional Notes Reason for block:procedure for pain

## 2019-03-31 ENCOUNTER — Encounter (HOSPITAL_COMMUNITY): Payer: Self-pay | Admitting: *Deleted

## 2019-03-31 LAB — CBC
HCT: 32 % — ABNORMAL LOW (ref 36.0–46.0)
Hemoglobin: 10.4 g/dL — ABNORMAL LOW (ref 12.0–15.0)
MCH: 28.9 pg (ref 26.0–34.0)
MCHC: 32.5 g/dL (ref 30.0–36.0)
MCV: 88.9 fL (ref 80.0–100.0)
Platelets: 170 10*3/uL (ref 150–400)
RBC: 3.6 MIL/uL — ABNORMAL LOW (ref 3.87–5.11)
RDW: 13.9 % (ref 11.5–15.5)
WBC: 11.7 10*3/uL — ABNORMAL HIGH (ref 4.0–10.5)
nRBC: 0 % (ref 0.0–0.2)

## 2019-03-31 LAB — RPR: RPR Ser Ql: NONREACTIVE

## 2019-03-31 MED ORDER — IBUPROFEN 600 MG PO TABS: 600.0000 mg | ORAL_TABLET | Freq: Four times a day (QID) | ORAL | 1 refills | Status: AC | PRN

## 2019-03-31 MED ORDER — PRENATAL MULTIVITAMIN CH: 1.0000 | ORAL_TABLET | Freq: Every day | ORAL | 3 refills | Status: AC

## 2019-03-31 NOTE — Anesthesia Postprocedure Evaluation (Signed)
Anesthesia Post Note  Patient: Stacey Parks  Procedure(s) Performed: AN AD [place]     Patient location during evaluation: Mother Baby Anesthesia Type: Epidural Level of consciousness: awake and alert Pain management: pain level controlled Vital Signs Assessment: post-procedure vital signs reviewed and stable Respiratory status: spontaneous breathing Cardiovascular status: blood pressure returned to baseline Postop Assessment: no headache, adequate PO intake, no backache, able to ambulate, patient able to bend at knees, epidural receding and no apparent nausea or vomiting Anesthetic complications: no    Last Vitals:  Vitals:   03/30/19 2333 03/31/19 0501  BP: 128/79 124/86  Pulse: 75 76  Resp: 18   Temp: 37.1 C 36.7 C  SpO2: 96% 97%    Last Pain:  Vitals:   03/31/19 0502  TempSrc:   PainSc: 5    Pain Goal:                   Ailene Ards

## 2019-03-31 NOTE — Anesthesia Postprocedure Evaluation (Signed)
Anesthesia Post Note  Patient: Stacey Parks  Procedure(s) Performed: AN AD [place]     Patient location during evaluation: Mother Baby Anesthesia Type: Epidural Level of consciousness: awake and alert Pain management: pain level controlled Respiratory status: spontaneous breathing Cardiovascular status: stable Postop Assessment: no headache, adequate PO intake, no backache, patient able to bend at knees, able to ambulate, epidural receding and no apparent nausea or vomiting Anesthetic complications: no    Last Vitals:  Vitals:   03/30/19 2333 03/31/19 0501  BP: 128/79 124/86  Pulse: 75 76  Resp: 18   Temp: 37.1 C 36.7 C  SpO2: 96% 97%    Last Pain:  Vitals:   03/31/19 0747  TempSrc:   PainSc: 4    Pain Goal:                Epidural/Spinal Function Cutaneous sensation: Normal sensation (03/31/19 0747), Patient able to flex knees: Yes (03/31/19 0747), Patient able to lift hips off bed: Yes (03/31/19 0747), Back pain beyond tenderness at insertion site: No (03/31/19 0747), Progressively worsening motor and/or sensory loss: No (03/31/19 0747), Bowel and/or bladder incontinence post epidural: No (03/31/19 0747)  Ailene Ards

## 2019-03-31 NOTE — Progress Notes (Addendum)
Post Partum Day 1 Subjective: no complaints, up ad lib, voiding, tolerating PO and nl lochia, pain controlled  Objective: Blood pressure 124/86, pulse 76, temperature 98.1 F (36.7 C), temperature source Oral, resp. rate 18, height 5\' 4"  (1.626 m), weight 87.5 kg, SpO2 97 %, unknown if currently breastfeeding.  Physical Exam:  General: alert and no distress Lochia: appropriate Uterine Fundus: firm   Recent Labs    03/30/19 1222 03/31/19 0717  HGB 12.2 10.4*  HCT 38.5 32.0*    Assessment/Plan: Plan for discharge tomorrow, Breastfeeding and Lactation consult.  Routine PP care.    Desires d/c home today.  Will d/c with Motrin and PNV.  F/u 6 weeks   LOS: 1 day   Tristy Udovich Bovard-Stuckert 03/31/2019, 9:02 AM

## 2019-03-31 NOTE — Lactation Note (Addendum)
This note was copied from a baby's chart. Lactation Consultation Note  Patient Name: Stacey Parks Date: 03/31/2019 Reason for consult: Initial assessment;1st time breastfeeding P1, 33 hour female infant. Mom doesn't want an interpreter she speak English as well as her husband. Infant was asleep in basinet when [facility] entered the room. Per mom, infant has breastfed 7 x at the breast. Infant had 3 voids and one stool since birth. Mm feels breastfeeding is going well. Per dad, infant had been feeding 10 to 20 minutes most feedings. LC did not observe latch infant breastfed 1 hour prior to [facility] entering the room and mom hand express giving infant 2 ml of colostrum by spoon. Mom knows to breastfeed infant according hunger cues, 8 to 12 times within 24 hours and on demand. Mom was given DEBP by Nurse and mom plans to pump after latching infant to breast every 3 hours for 15 minutes on initial setting. LC discussed breast milk storage and collection due mom's questions about pumping and storing breast milk. Mom knows if she has any further questions, concerns or need assistance with latching infant to breast to ask Nurse or [place]. Reviewed Baby & Me book's Breastfeeding Basics.  Mom made aware of O/P services, breastfeeding support groups, community resources, and our phone # for post-discharge questions.   Maternal Data Formula Feeding for Exclusion: No Has patient been taught Hand Expression?: Yes(by Nurse per mom at last feeding infant was given 2 ml of colostrum by spoon.)  Feeding Feeding Type: Breast Fed  LATCH Score Latch: Grasps breast easily, tongue down, lips flanged, rhythmical sucking.  Audible Swallowing: A few with stimulation  Type of Nipple: Everted at rest and after stimulation  Comfort (Breast/Nipple): Soft / non-tender  Hold (Positioning): Assistance needed to correctly position infant at breast and maintain latch.  LATCH Score:  8  Interventions Interventions: Breast feeding basics reviewed;Skin to skin  Lactation Tools Discussed/Used WIC Program: No Pump Review: Setup, frequency, and cleaning;Milk Storage Initiated by:: by Nurse Date initiated:: 03/30/19   Consult Status Consult Status: Follow-up Date: 03/31/19 Follow-up type: In-patient    Stacey Parks 03/31/2019, 5:33 AM

## 2019-03-31 NOTE — Discharge Summary (Addendum)
OB Discharge Summary     Patient Name: Stacey Parks DOB: [birth year] MRN: [id number]  Date of admission: 03/30/2019 Delivering MD[facility]   Date of discharge: 03/31/2019  Admitting diagnosis: DFM Intrauterine pregnancy: 1755w4d     Secondary diagnosis:  Active Problems:   Labor and delivery, indication for care   Postpartum care following vaginal delivery   Decreased fetal movement   NST (non-stress test) reactive on fetal surveillance   Vaginal delivery  Additional problems: N/A     Discharge diagnosis: Term Pregnancy Delivered                                                                                                Post partum procedures:N/A  Augmentation: AROM  Complications: None  Hospital course:  Induction of Labor With Vaginal Delivery   38 y.o. yo G2P1001 at 6555w4d was admitted to the hospital 03/30/2019 for induction of labor.  Indication for induction: Favorable cervix at term.  Patient had an uncomplicated labor course as follows: Membrane Rupture Time/Date: 2:11 PM ,03/30/2019   Intrapartum Procedures: Episiotomy: None [1]                                         Lacerations:  2nd degree [3]  Patient had delivery of a Viable infant.  Information for the patient's newborn:  Stacey Parks, Girl Stacey Parks [[id number]][[id number]]  Delivery Method: Vag-Spont    03/30/2019  Details of delivery can be found in separate delivery note.  Patient had a routine postpartum course. Patient is discharged home 03/31/19.  Physical exam  Vitals:   03/30/19 1946 03/30/19 2032 03/30/19 2333 03/31/19 0501  BP: 138/80 138/83 128/79 124/86  Pulse: 84 83 75 76  Resp: 18 18 18    Temp: 98.2 F (36.8 C) 98.8 F (37.1 C) 98.8 F (37.1 C) 98.1 F (36.7 C)  TempSrc:  Oral Oral Oral  SpO2: 98% 96% 96% 97%  Weight:      Height:       General: alert and no distress Lochia: appropriate Uterine Fundus: firm  Labs: Lab Results  Component Value Date   WBC 11.7  (H) 03/31/2019   HGB 10.4 (L) 03/31/2019   HCT 32.0 (L) 03/31/2019   MCV 88.9 03/31/2019   PLT 170 03/31/2019   CMP Latest Ref Rng & Units 03/30/2019  Glucose 70 - 99 mg/dL 75  BUN 6 - 20 mg/dL 15  Creatinine 7.820.44 - 9.561.00 mg/dL 2.130.67  Sodium 086135 - 578145 mmol/L 136  Potassium 3.5 - 5.1 mmol/L 4.3  Chloride 98 - 111 mmol/L 107  CO2 22 - 32 mmol/L 18(L)  Calcium 8.9 - 10.3 mg/dL 9.4  Total Protein 6.5 - 8.1 g/dL 7.2  Total Bilirubin 0.3 - 1.2 mg/dL 4.6(N0.2(L)  Alkaline Phos 38 - 126 U/L 224(H)  AST 15 - 41 U/L 22  ALT 0 - 44 U/L 11    Discharge instruction: per After Visit Summary and "Baby and Me Booklet".  After visit meds:  Allergies as of 03/31/2019   No Known Allergies     Medication List    TAKE these medications   ibuprofen 600 MG tablet Commonly known as: ADVIL Take 1 tablet (600 mg total) by mouth every 6 (six) hours as needed.   levothyroxine 25 MCG tablet Commonly known as: SYNTHROID Take 25 mcg by mouth daily.   prenatal multivitamin Tabs tablet Take 1 tablet by mouth daily.       Diet: routine diet  Activity: Advance as tolerated. Pelvic rest for 6 weeks.   Outpatient follow up:2 and 6 weeks Follow up Appt:No future appointments. Follow up Visit:No follow-ups on file.  Postpartum contraception: Undecided  Newborn Data: Live born female  Birth Weight: 8 lb 13.3 oz (4005 g) APGAR: 25, 9  Newborn Delivery   Birth date/time: 03/30/2019 17:32:00 Delivery type: Vaginal, Spontaneous      Baby Feeding: Breast Disposition:home with mother   03/31/2019 Stacey Contes, MD

## 2019-04-01 ENCOUNTER — Ambulatory Visit: Payer: Self-pay

## 2019-04-01 NOTE — Lactation Note (Signed)
This note was copied from a baby's chart. Lactation Consultation Note  Patient Name: Stacey Parks Date: 04/01/2019 Reason for consult: Mother's request;Difficult latch P1, 35 hour female infant -3% weight loss. Per parents, infant has been cluster feeding per mom infant doesn't sustain latch breastfed 5 minutes then fall asleep. LC entered room infant in basinet asleep mom open to [facility] assisting her with infant latch.  LC reviewed hand expression and work with mom on expressing breast milk, infant was given 2 ml of colostrum from spoon. Mom feels her nipples are to large for infant to latch well, LC re-assured mom her nipples were not to large. Mom latched infant on left breast using the cross cradle hold, LC ask mom express small amount of colostrum out from breast, tickle infant below nose with breast, when infant tongue was down bring infant chin first to breast. Infant's nose and chin was touching breast, swallows were heard and observed by LC, infant sustained latch and was still breastfeeding after 15 minutes. Per mom, this is the longest infant has sustained latch at breast, previously infant was on and off breast after 5 minutes. LC discussed tips to keep infant stimulated to breastfeed longer at breast during breast compression, rubbing infant neck and shoulder, talking to infant. Mom will continue to work towards infant sustaining latch and breastfeeding for a longer duration. Mom knows to call Nurse or [place] if she has any questions, concerns or need further assistance with latching infant to breast.   Maternal Data    Feeding    LATCH Score Latch: Grasps breast easily, tongue down, lips flanged, rhythmical sucking.  Audible Swallowing: Spontaneous and intermittent  Type of Nipple: Everted at rest and after stimulation  Comfort (Breast/Nipple): Soft / non-tender  Hold (Positioning): Assistance needed to correctly position infant at breast and maintain  latch.  LATCH Score: 9  Interventions Interventions: Assisted with latch;Adjust position;Skin to skin;Support pillows;Breast massage;Position options;Hand express;Expressed milk;Breast compression  Lactation Tools Discussed/Used     Consult Status Consult Status: Follow-up Date: 04/01/19 Follow-up type: In-patient    Stacey Parks 04/01/2019, 4:53 AM

## 2019-04-01 NOTE — Lactation Note (Signed)
This note was copied from a baby's chart. Lactation Consultation Note  Patient Name: Stacey Parks [id number] Date: 04/01/2019 Reason for consult: Follow-up assessment;Term;Infant weight loss  Mom declined interpreter services for [place] when offered, dad fluent in [place], he asked most of the questions along with mom.  41 hours old FT female who is being exclusively BF by her mother, she's a P2 and has some experience BF, she BF her first baby for 3 months. Mom and baby are going home today, baby is at 7% weight loss. Baby resting on mom's lap but uncovered, discussed the importance of STS and how to cover baby when they're not feeding at the breast.  Reviewed discharge instructions, engorgement prevention and treatment, treatment/prevention for sore nipples and BF basics. Dad was very involved and asked a lot of questions. Discussed lactogenesis II, size of baby's stomach, feeding cues, cluster feeding, supplementation of EBM and pumping schedule.   Per mom BF is now going much better after she got help from [place], the lactation expert last night, she's now getting a deeper latch and not falling asleep at the breast like she used to. They're very appreciative of all the BF help they got from lactation and nursing staff.   Parents reported all questions and concerns were answered, they're both aware of [place] OP services and will call PRN.  Maternal Data    Feeding Feeding Type: Breast Fed  LATCH Score                   Interventions Interventions: Breast feeding basics reviewed  Lactation Tools Discussed/Used     Consult Status Consult Status: Complete Date: 04/01/19 Follow-up type: In-patient    Merridy Pascoe Francene Boyers 04/01/2019, 11:29 AM

## 2019-10-01 ENCOUNTER — Ambulatory Visit: Payer: BC Managed Care – PPO | Attending: Internal Medicine

## 2019-10-01 DIAGNOSIS — Z23 Encounter for immunization: Secondary | ICD-10-CM

## 2019-10-01 NOTE — Progress Notes (Signed)
   Covid-19 Vaccination Clinic  Name:  Ethne Jeon    MRN: [id number] DOB: Feb 26, 1981  10/01/2019  [name] was observed post Covid-19 immunization for 15 minutes without incident. She was provided with Vaccine Information Sheet and instruction to access the V-Safe system.   Ms. Butch was instructed to call 911 with any severe reactions post vaccine: Marland Kitchen Difficulty breathing  . Swelling of face and throat  . A fast heartbeat  . A bad rash all over body  . Dizziness and weakness   Immunizations Administered    Name Date Dose VIS Date Route   Pfizer COVID-19 Vaccine 10/01/2019  2:58 PM 0.3 mL 06/26/2019 Intramuscular   Manufacturer: [employer], [employer]   Lot: [id number]   NDC: [address]-5

## 2019-10-28 ENCOUNTER — Ambulatory Visit: Payer: BC Managed Care – PPO | Attending: Internal Medicine

## 2019-10-28 DIAGNOSIS — Z23 Encounter for immunization: Secondary | ICD-10-CM

## 2019-10-28 NOTE — Progress Notes (Signed)
   Covid-19 Vaccination Clinic  Name:  Stacey Parks    MRN: [id number] DOB: March 03, 1981  10/28/2019  [name] was observed post Covid-19 immunization for 15 minutes without incident. She was provided with Vaccine Information Sheet and instruction to access the V-Safe system.   [name] was instructed to call 911 with any severe reactions post vaccine: Marland Kitchen Difficulty breathing  . Swelling of face and throat  . A fast heartbeat  . A bad rash all over body  . Dizziness and weakness   Immunizations Administered    Name Date Dose VIS Date Route   Pfizer COVID-19 Vaccine 10/28/2019  2:39 PM 0.3 mL 06/26/2019 Intramuscular   Manufacturer: [employer], [employer]   Lot: [id number]   NDC: [address]-8

## 2020-05-05 ENCOUNTER — Other Ambulatory Visit: Payer: Self-pay | Admitting: Obstetrics and Gynecology

## 2020-05-06 ENCOUNTER — Other Ambulatory Visit: Payer: Self-pay | Admitting: Obstetrics and Gynecology

## 2020-05-06 DIAGNOSIS — N6009 Solitary cyst of unspecified breast: Secondary | ICD-10-CM

## 2020-05-25 ENCOUNTER — Other Ambulatory Visit: Payer: Self-pay

## 2020-05-25 ENCOUNTER — Ambulatory Visit
Admission: RE | Admit: 2020-05-25 | Discharge: 2020-05-25 | Disposition: A | Discharge status: Home / Self Care | Payer: BC Managed Care – PPO | Source: Ambulatory Visit | Attending: Obstetrics and Gynecology | Admitting: Obstetrics and Gynecology

## 2020-05-25 DIAGNOSIS — N6009 Solitary cyst of unspecified breast: Secondary | ICD-10-CM

## 2021-08-07 DIAGNOSIS — E559 Vitamin D deficiency, unspecified: Secondary | ICD-10-CM | POA: Diagnosis not present

## 2021-08-07 DIAGNOSIS — R635 Abnormal weight gain: Secondary | ICD-10-CM | POA: Diagnosis not present

## 2021-08-07 DIAGNOSIS — N951 Menopausal and female climacteric states: Secondary | ICD-10-CM | POA: Diagnosis not present

## 2021-08-14 DIAGNOSIS — Z6831 Body mass index (BMI) 31.0-31.9, adult: Secondary | ICD-10-CM | POA: Diagnosis not present

## 2021-08-14 DIAGNOSIS — N951 Menopausal and female climacteric states: Secondary | ICD-10-CM | POA: Diagnosis not present

## 2021-08-14 DIAGNOSIS — R635 Abnormal weight gain: Secondary | ICD-10-CM | POA: Diagnosis not present

## 2021-08-14 DIAGNOSIS — Z1339 Encounter for screening examination for other mental health and behavioral disorders: Secondary | ICD-10-CM | POA: Diagnosis not present

## 2021-08-14 DIAGNOSIS — E559 Vitamin D deficiency, unspecified: Secondary | ICD-10-CM | POA: Diagnosis not present

## 2021-08-14 DIAGNOSIS — Z1331 Encounter for screening for depression: Secondary | ICD-10-CM | POA: Diagnosis not present

## 2021-08-24 DIAGNOSIS — Z6830 Body mass index (BMI) 30.0-30.9, adult: Secondary | ICD-10-CM | POA: Diagnosis not present

## 2021-08-24 DIAGNOSIS — E559 Vitamin D deficiency, unspecified: Secondary | ICD-10-CM | POA: Diagnosis not present

## 2021-08-31 DIAGNOSIS — Z6830 Body mass index (BMI) 30.0-30.9, adult: Secondary | ICD-10-CM | POA: Diagnosis not present

## 2021-08-31 DIAGNOSIS — E559 Vitamin D deficiency, unspecified: Secondary | ICD-10-CM | POA: Diagnosis not present

## 2021-09-07 DIAGNOSIS — Z6830 Body mass index (BMI) 30.0-30.9, adult: Secondary | ICD-10-CM | POA: Diagnosis not present

## 2021-09-07 DIAGNOSIS — E559 Vitamin D deficiency, unspecified: Secondary | ICD-10-CM | POA: Diagnosis not present

## 2021-09-21 DIAGNOSIS — E559 Vitamin D deficiency, unspecified: Secondary | ICD-10-CM | POA: Diagnosis not present

## 2021-09-21 DIAGNOSIS — Z6829 Body mass index (BMI) 29.0-29.9, adult: Secondary | ICD-10-CM | POA: Diagnosis not present

## 2021-09-28 DIAGNOSIS — E559 Vitamin D deficiency, unspecified: Secondary | ICD-10-CM | POA: Diagnosis not present

## 2021-09-28 DIAGNOSIS — Z6829 Body mass index (BMI) 29.0-29.9, adult: Secondary | ICD-10-CM | POA: Diagnosis not present

## 2021-09-30 DIAGNOSIS — J029 Acute pharyngitis, unspecified: Secondary | ICD-10-CM | POA: Diagnosis not present

## 2021-09-30 DIAGNOSIS — J020 Streptococcal pharyngitis: Secondary | ICD-10-CM | POA: Diagnosis not present

## 2021-10-12 DIAGNOSIS — Z6829 Body mass index (BMI) 29.0-29.9, adult: Secondary | ICD-10-CM | POA: Diagnosis not present

## 2021-10-12 DIAGNOSIS — E559 Vitamin D deficiency, unspecified: Secondary | ICD-10-CM | POA: Diagnosis not present

## 2021-10-26 DIAGNOSIS — E559 Vitamin D deficiency, unspecified: Secondary | ICD-10-CM | POA: Diagnosis not present

## 2021-10-26 DIAGNOSIS — Z6828 Body mass index (BMI) 28.0-28.9, adult: Secondary | ICD-10-CM | POA: Diagnosis not present

## 2021-11-09 DIAGNOSIS — Z6828 Body mass index (BMI) 28.0-28.9, adult: Secondary | ICD-10-CM | POA: Diagnosis not present

## 2021-11-09 DIAGNOSIS — E559 Vitamin D deficiency, unspecified: Secondary | ICD-10-CM | POA: Diagnosis not present

## 2021-11-09 DIAGNOSIS — R635 Abnormal weight gain: Secondary | ICD-10-CM | POA: Diagnosis not present

## 2021-11-09 DIAGNOSIS — N951 Menopausal and female climacteric states: Secondary | ICD-10-CM | POA: Diagnosis not present

## 2021-11-23 DIAGNOSIS — N951 Menopausal and female climacteric states: Secondary | ICD-10-CM | POA: Diagnosis not present

## 2021-11-23 DIAGNOSIS — E559 Vitamin D deficiency, unspecified: Secondary | ICD-10-CM | POA: Diagnosis not present

## 2021-11-23 DIAGNOSIS — Z6827 Body mass index (BMI) 27.0-27.9, adult: Secondary | ICD-10-CM | POA: Diagnosis not present

## 2021-11-29 DIAGNOSIS — Z13220 Encounter for screening for lipoid disorders: Secondary | ICD-10-CM | POA: Diagnosis not present

## 2021-11-29 DIAGNOSIS — Z803 Family history of malignant neoplasm of breast: Secondary | ICD-10-CM | POA: Diagnosis not present

## 2021-11-29 DIAGNOSIS — Z0000 Encounter for general adult medical examination without abnormal findings: Secondary | ICD-10-CM | POA: Diagnosis not present

## 2021-11-29 DIAGNOSIS — J3089 Other allergic rhinitis: Secondary | ICD-10-CM | POA: Diagnosis not present

## 2021-11-29 DIAGNOSIS — E559 Vitamin D deficiency, unspecified: Secondary | ICD-10-CM | POA: Diagnosis not present

## 2021-12-06 DIAGNOSIS — Z6827 Body mass index (BMI) 27.0-27.9, adult: Secondary | ICD-10-CM | POA: Diagnosis not present

## 2021-12-06 DIAGNOSIS — E559 Vitamin D deficiency, unspecified: Secondary | ICD-10-CM | POA: Diagnosis not present

## 2021-12-19 DIAGNOSIS — R928 Other abnormal and inconclusive findings on diagnostic imaging of breast: Secondary | ICD-10-CM | POA: Diagnosis not present

## 2021-12-19 DIAGNOSIS — Z803 Family history of malignant neoplasm of breast: Secondary | ICD-10-CM | POA: Diagnosis not present

## 2021-12-19 DIAGNOSIS — Z1231 Encounter for screening mammogram for malignant neoplasm of breast: Secondary | ICD-10-CM | POA: Diagnosis not present

## 2021-12-26 ENCOUNTER — Other Ambulatory Visit: Payer: Self-pay | Admitting: Physician Assistant

## 2021-12-26 DIAGNOSIS — R928 Other abnormal and inconclusive findings on diagnostic imaging of breast: Secondary | ICD-10-CM | POA: Diagnosis not present

## 2021-12-26 DIAGNOSIS — N6312 Unspecified lump in the right breast, upper inner quadrant: Secondary | ICD-10-CM | POA: Diagnosis not present

## 2021-12-27 ENCOUNTER — Ambulatory Visit
Admission: RE | Admit: 2021-12-27 | Discharge: 2021-12-27 | Disposition: A | Discharge status: Home / Self Care | Payer: BC Managed Care – PPO | Source: Ambulatory Visit | Attending: Physician Assistant | Admitting: Physician Assistant

## 2021-12-27 DIAGNOSIS — N6312 Unspecified lump in the right breast, upper inner quadrant: Secondary | ICD-10-CM | POA: Diagnosis not present

## 2021-12-27 DIAGNOSIS — R928 Other abnormal and inconclusive findings on diagnostic imaging of breast: Secondary | ICD-10-CM

## 2021-12-27 DIAGNOSIS — N6021 Fibroadenosis of right breast: Secondary | ICD-10-CM | POA: Diagnosis not present

## 2022-01-08 DIAGNOSIS — R0982 Postnasal drip: Secondary | ICD-10-CM | POA: Diagnosis not present

## 2022-01-08 DIAGNOSIS — H1013 Acute atopic conjunctivitis, bilateral: Secondary | ICD-10-CM | POA: Diagnosis not present

## 2022-01-08 DIAGNOSIS — Z9109 Other allergy status, other than to drugs and biological substances: Secondary | ICD-10-CM | POA: Diagnosis not present

## 2022-01-09 DIAGNOSIS — B309 Viral conjunctivitis, unspecified: Secondary | ICD-10-CM | POA: Diagnosis not present

## 2022-03-27 DIAGNOSIS — T781XXA Other adverse food reactions, not elsewhere classified, initial encounter: Secondary | ICD-10-CM | POA: Diagnosis not present

## 2022-03-27 DIAGNOSIS — J310 Chronic rhinitis: Secondary | ICD-10-CM | POA: Diagnosis not present

## 2022-11-05 DIAGNOSIS — H0288B Meibomian gland dysfunction left eye, upper and lower eyelids: Secondary | ICD-10-CM | POA: Diagnosis not present

## 2022-11-05 DIAGNOSIS — H0288A Meibomian gland dysfunction right eye, upper and lower eyelids: Secondary | ICD-10-CM | POA: Diagnosis not present

## 2022-11-05 DIAGNOSIS — H1045 Other chronic allergic conjunctivitis: Secondary | ICD-10-CM | POA: Diagnosis not present

## 2022-12-26 DIAGNOSIS — J029 Acute pharyngitis, unspecified: Secondary | ICD-10-CM | POA: Diagnosis not present

## 2022-12-26 DIAGNOSIS — J020 Streptococcal pharyngitis: Secondary | ICD-10-CM | POA: Diagnosis not present

## 2023-02-20 ENCOUNTER — Ambulatory Visit (INDEPENDENT_AMBULATORY_CARE_PROVIDER_SITE_OTHER): Payer: BC Managed Care – PPO

## 2023-02-20 ENCOUNTER — Encounter (HOSPITAL_BASED_OUTPATIENT_CLINIC_OR_DEPARTMENT_OTHER): Payer: Self-pay | Admitting: Student

## 2023-02-20 ENCOUNTER — Ambulatory Visit (HOSPITAL_BASED_OUTPATIENT_CLINIC_OR_DEPARTMENT_OTHER): Payer: BC Managed Care – PPO | Admitting: Student

## 2023-02-20 DIAGNOSIS — S9030XA Contusion of unspecified foot, initial encounter: Secondary | ICD-10-CM

## 2023-02-20 DIAGNOSIS — M25571 Pain in right ankle and joints of right foot: Secondary | ICD-10-CM | POA: Diagnosis not present

## 2023-02-20 DIAGNOSIS — M79671 Pain in right foot: Secondary | ICD-10-CM

## 2023-02-20 NOTE — Progress Notes (Signed)
Chief Complaint: Right foot pain     History of Present Illness:    Stacey Parks is a 42 y.o. female presenting today for evaluation of pain in the right foot.  This began about 2 days ago after multiple occurrences of objects including a smart tablet falling onto the top of the foot.  Patient reports that the pain and swelling is located directly on the top of the foot.  This is painful with ankle range of motion.  She has tried ibuprofen, Salonpas patches, Tiger balm, and heat therapy so far with mild relief.  Patient is leaving in 10 days with her family on a trip to [place], [place], and [place].   Surgical History:   None  PMH/PSH/Family History/Social History/Meds/Allergies:    Past Medical History:  Diagnosis Date   Hypothyroidism    Medical history non-contributory    PPH (postpartum hemorrhage) 11/03/2017   Pregnancy induced hypertension    Vaginal delivery 03/30/2019   Past Surgical History:  Procedure Laterality Date   NO PAST SURGERIES     Social History   Socioeconomic History   Marital status: Married    Spouse name: Not on file   Number of children: Not on file   Years of education: Not on file   Highest education level: Not on file  Occupational History   Not on file  Tobacco Use   Smoking status: Never   Smokeless tobacco: Never  Substance and Sexual Activity   Alcohol use: No   Drug use: No   Sexual activity: Yes    Birth control/protection: None  Other Topics Concern   Not on file  Social History Narrative   Not on file   Social Determinants of Health   Financial Resource Strain: Not on file  Food Insecurity: No Food Insecurity (11/08/2020)   Received from [facility], [facility]   Hunger Vital Sign    Worried About Running Out of Food in the Last Year: Never true    Ran Out of Food in the Last Year: Never true  Transportation Needs: Not on file  Physical Activity: Not on file  Stress: Not  on file  Social Connections: Unknown (11/21/2021)   Received from [facility], [facility]   Social Network    Social Network: Not on file   Family History  Problem Relation Age of Onset   Heart disease Father    Cancer Paternal Aunt    Breast cancer Paternal Aunt    Cancer Maternal Grandmother    Breast cancer Maternal Grandmother    No Known Allergies Current Outpatient Medications  Medication Sig Dispense Refill   ibuprofen (ADVIL) 600 MG tablet Take 1 tablet (600 mg total) by mouth every 6 (six) hours as needed. 45 tablet 1   levothyroxine (SYNTHROID) 25 MCG tablet Take 25 mcg by mouth daily.     Prenatal Vit-Fe Fumarate-FA (PRENATAL MULTIVITAMIN) TABS tablet Take 1 tablet by mouth daily. 100 tablet 3   No current facility-administered medications for this visit.   No results found.  Review of Systems:   A ROS was performed including pertinent positives and negatives as documented in the HPI.  Physical Exam :   Constitutional: NAD and appears stated age Neurological: Alert and oriented Psych: Appropriate affect and cooperative unknown if currently breastfeeding.   Comprehensive  Musculoskeletal Exam:    Small area of swelling and tenderness palpation over the dorsum of the midfoot without evidence of erythema or ecchymosis.  Full range of motion of the ankle, however plantarflexion does elicit pain in this area.  No tenderness throughout the rest of the foot or ankle.  DP and PT pulses are 2+.  Neurosensory exam of the foot is intact.  Imaging:   Xray (right foot 3 views, right ankle 3 views): Negative for fracture or dislocation.  Soft tissue swelling over the dorsum of the midfoot.    I personally reviewed and interpreted the radiographs.   Assessment:   42 y.o. female with pain over the dorsum of the right midfoot after having multiple objects dropped on this area over the past few days.  X-rays first does not today appear negative for any fracture or acute  abnormality.  Based on the mechanism and swelling over this area, I believe this is consistent with a contusion.  For treatment I would recommend continuing with ibuprofen as well as icing a few times a day.  Discussed that she can continue activities as tolerated and I would expect this to continue to improve over the next 1 to 2 weeks.  Patient also mentioned that she has been having some sciatic type symptoms at night, so I have given her some home exercises for this.  If this continues to be an issue or if follow-up on her foot pain is needed, I will happily see her back in clinic.  Plan :    - Continue with icing and ibuprofen - Return to clinic as needed     I personally saw and evaluated the patient, and participated in the management and treatment plan.  Hazle Nordmann, PA-C Orthopedics  This document was dictated using Conservation officer, historic buildings. A reasonable attempt at proof reading has been made to minimize errors.

## 2023-11-03 IMAGING — MG MM BREAST LOCALIZATION CLIP
6 series · 6 of 18 positions shown · non-contrast
Comparison: Previous exam(s).

CLINICAL DATA: Post biopsy mammogram of the right breast for clip
placement.

EXAM:
3D DIAGNOSTIC RIGHT MAMMOGRAM POST ULTRASOUND BIOPSY

[R CC synth-2D]
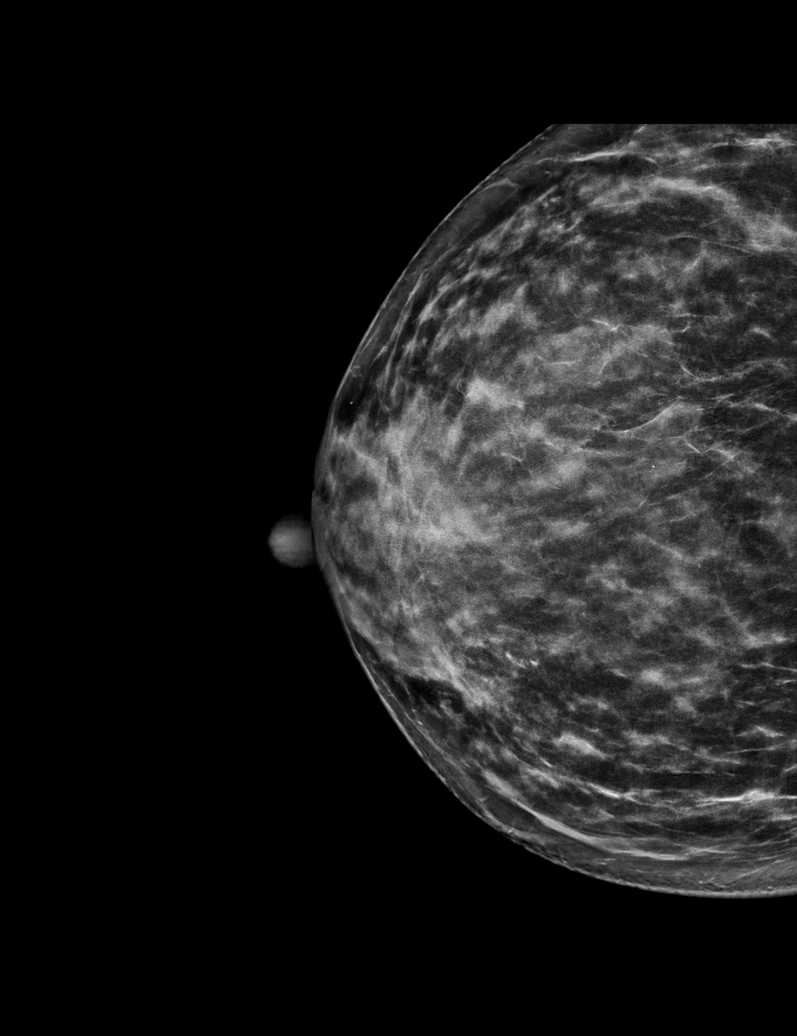

[R ML synth-2D]
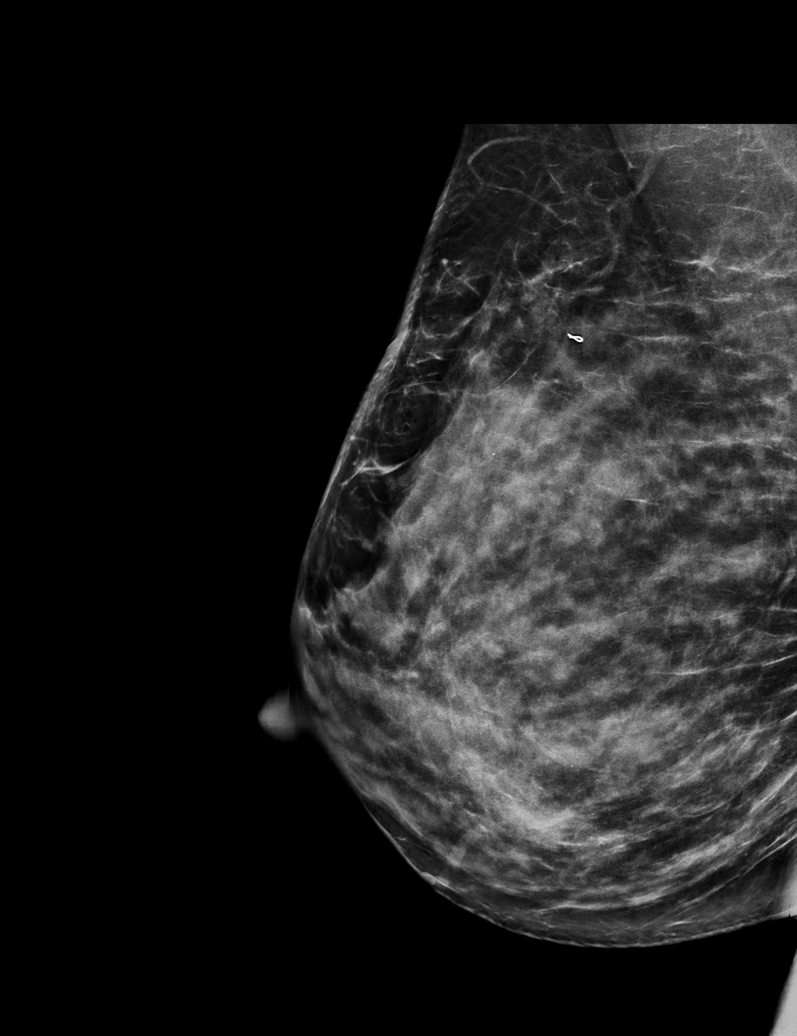

[R XCCL synth-2D]
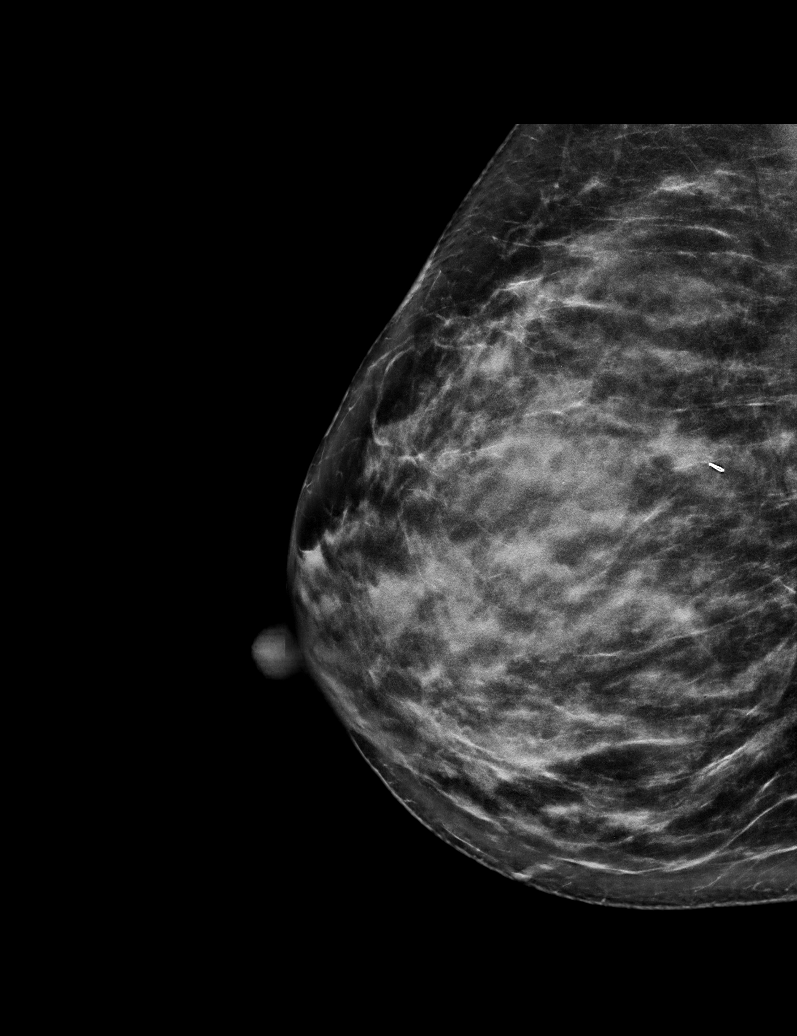

[R ML tomo · tomo slice 34/67.0]
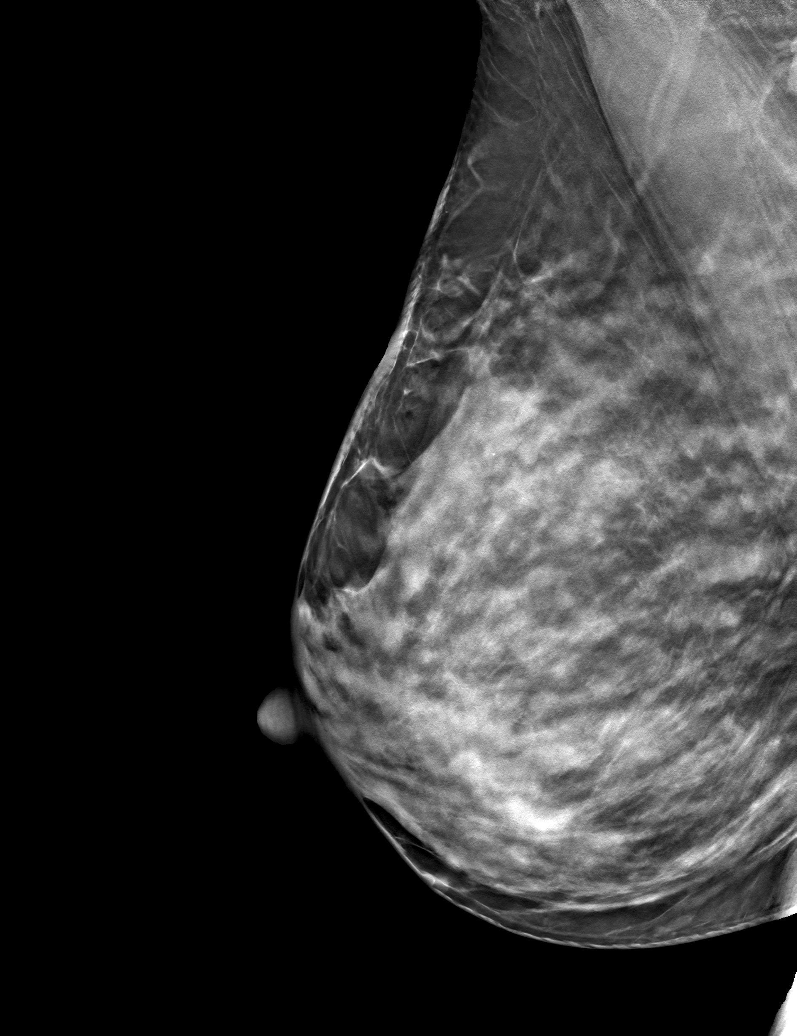

[R CC tomo · tomo slice 31/60.0]
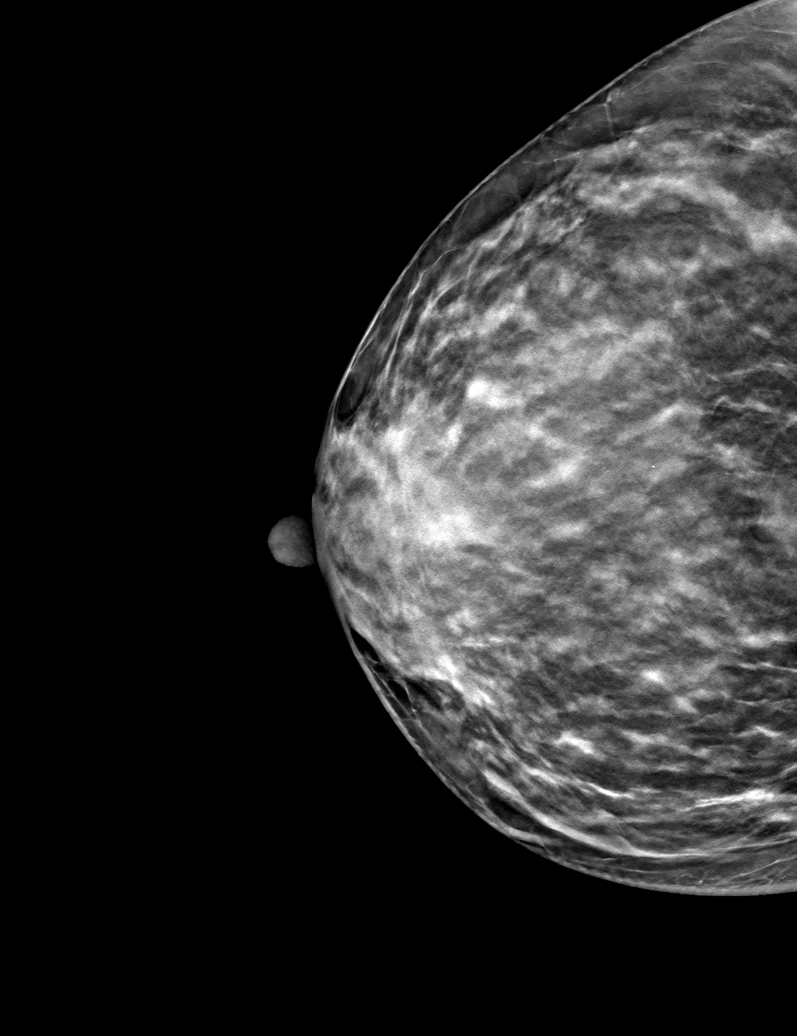

[R XCCL tomo · tomo slice 35/68.0]
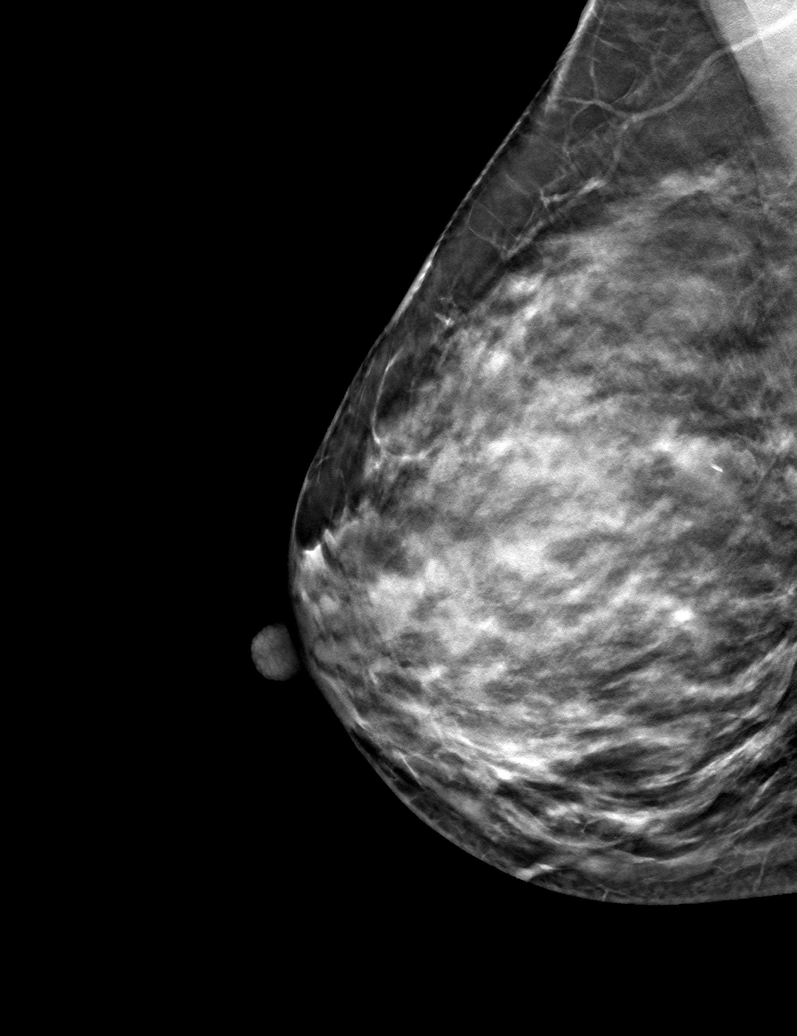

[6 of 18 positions shown; findings below may reference images not displayed]

FINDINGS: 3D Mammographic images were obtained following ultrasound guided
biopsy of a mass in the upper inner right breast. The biopsy marking
clip is in expected position at the site of biopsy.
IMPRESSION: Appropriate positioning of the ribbon shaped biopsy marking clip at
the site of biopsy in the upper inner right breast.

Final Assessment: Post Procedure Mammograms for Marker Placement
